# Patient Record
Sex: Female | Born: 1955 | Race: Black or African American | Hispanic: No | Smoking: Current every day smoker
Health system: Southern US, Community
[De-identification: ages and names within clinical notes are randomized; demographics above are authoritative.]

## PROBLEM LIST (undated history)

## (undated) DIAGNOSIS — I10 Essential (primary) hypertension: Secondary | ICD-10-CM

## (undated) DIAGNOSIS — M1990 Unspecified osteoarthritis, unspecified site: Secondary | ICD-10-CM

## (undated) HISTORY — PX: ABDOMINAL HYSTERECTOMY: SHX81

---

## 1999-05-21 ENCOUNTER — Encounter: Payer: Self-pay | Admitting: *Deleted

## 1999-05-21 ENCOUNTER — Emergency Department (HOSPITAL_COMMUNITY): Admission: EM | Admit: 1999-05-21 | Discharge: 1999-05-21 | Payer: Self-pay | Admitting: Emergency Medicine

## 2001-01-03 ENCOUNTER — Emergency Department (HOSPITAL_COMMUNITY): Admission: EM | Admit: 2001-01-03 | Discharge: 2001-01-03 | Payer: Self-pay | Admitting: Emergency Medicine

## 2001-01-03 ENCOUNTER — Encounter: Payer: Self-pay | Admitting: Emergency Medicine

## 2001-02-05 ENCOUNTER — Encounter: Payer: Self-pay | Admitting: Emergency Medicine

## 2001-02-05 ENCOUNTER — Emergency Department (HOSPITAL_COMMUNITY): Admission: EM | Admit: 2001-02-05 | Discharge: 2001-02-05 | Payer: Self-pay | Admitting: Emergency Medicine

## 2001-02-23 ENCOUNTER — Emergency Department (HOSPITAL_COMMUNITY): Admission: EM | Admit: 2001-02-23 | Discharge: 2001-02-23 | Payer: Self-pay | Admitting: Emergency Medicine

## 2001-02-23 ENCOUNTER — Encounter: Payer: Self-pay | Admitting: Emergency Medicine

## 2001-07-05 ENCOUNTER — Encounter: Payer: Self-pay | Admitting: Emergency Medicine

## 2001-07-05 ENCOUNTER — Emergency Department (HOSPITAL_COMMUNITY): Admission: EM | Admit: 2001-07-05 | Discharge: 2001-07-05 | Payer: Self-pay | Admitting: Emergency Medicine

## 2001-08-03 ENCOUNTER — Encounter: Payer: Self-pay | Admitting: *Deleted

## 2001-08-03 ENCOUNTER — Inpatient Hospital Stay (HOSPITAL_COMMUNITY): Admission: AD | Admit: 2001-08-03 | Discharge: 2001-08-03 | Payer: Self-pay | Admitting: Gynecology

## 2001-08-06 ENCOUNTER — Other Ambulatory Visit: Admission: RE | Admit: 2001-08-06 | Discharge: 2001-08-06 | Payer: Self-pay | Admitting: *Deleted

## 2002-04-07 ENCOUNTER — Emergency Department (HOSPITAL_COMMUNITY): Admission: EM | Admit: 2002-04-07 | Discharge: 2002-04-07 | Payer: Self-pay | Admitting: Emergency Medicine

## 2002-04-12 ENCOUNTER — Encounter: Admission: RE | Admit: 2002-04-12 | Discharge: 2002-04-12 | Payer: Self-pay | Admitting: *Deleted

## 2002-04-19 ENCOUNTER — Inpatient Hospital Stay (HOSPITAL_COMMUNITY): Admission: AD | Admit: 2002-04-19 | Discharge: 2002-04-20 | Payer: Self-pay | Admitting: *Deleted

## 2002-04-20 ENCOUNTER — Encounter (INDEPENDENT_AMBULATORY_CARE_PROVIDER_SITE_OTHER): Payer: Self-pay | Admitting: *Deleted

## 2002-04-26 ENCOUNTER — Encounter: Admission: RE | Admit: 2002-04-26 | Discharge: 2002-04-26 | Payer: Self-pay | Admitting: *Deleted

## 2002-11-17 ENCOUNTER — Encounter: Payer: Self-pay | Admitting: Family Medicine

## 2002-11-17 ENCOUNTER — Inpatient Hospital Stay (HOSPITAL_COMMUNITY): Admission: AD | Admit: 2002-11-17 | Discharge: 2002-11-19 | Payer: Self-pay | Admitting: Family Medicine

## 2002-12-12 ENCOUNTER — Inpatient Hospital Stay (HOSPITAL_COMMUNITY): Admission: AD | Admit: 2002-12-12 | Discharge: 2002-12-12 | Payer: Self-pay | Admitting: Obstetrics and Gynecology

## 2002-12-15 ENCOUNTER — Encounter: Admission: RE | Admit: 2002-12-15 | Discharge: 2002-12-15 | Payer: Self-pay | Admitting: Obstetrics and Gynecology

## 2002-12-27 ENCOUNTER — Observation Stay (HOSPITAL_COMMUNITY): Admission: AD | Admit: 2002-12-27 | Discharge: 2002-12-28 | Payer: Self-pay | Admitting: *Deleted

## 2002-12-29 ENCOUNTER — Encounter (INDEPENDENT_AMBULATORY_CARE_PROVIDER_SITE_OTHER): Payer: Self-pay | Admitting: *Deleted

## 2002-12-29 ENCOUNTER — Inpatient Hospital Stay (HOSPITAL_COMMUNITY): Admission: RE | Admit: 2002-12-29 | Discharge: 2002-12-31 | Payer: Self-pay | Admitting: Obstetrics and Gynecology

## 2003-01-10 ENCOUNTER — Encounter: Admission: RE | Admit: 2003-01-10 | Discharge: 2003-01-10 | Payer: Self-pay | Admitting: Obstetrics and Gynecology

## 2003-01-24 ENCOUNTER — Encounter: Admission: RE | Admit: 2003-01-24 | Discharge: 2003-01-24 | Payer: Self-pay | Admitting: Obstetrics and Gynecology

## 2003-02-09 ENCOUNTER — Encounter: Admission: RE | Admit: 2003-02-09 | Discharge: 2003-02-09 | Payer: Self-pay | Admitting: Family Medicine

## 2003-10-20 ENCOUNTER — Emergency Department (HOSPITAL_COMMUNITY): Admission: EM | Admit: 2003-10-20 | Discharge: 2003-10-20 | Payer: Self-pay | Admitting: Emergency Medicine

## 2005-04-04 ENCOUNTER — Emergency Department (HOSPITAL_COMMUNITY): Admission: EM | Admit: 2005-04-04 | Discharge: 2005-04-04 | Payer: Self-pay | Admitting: *Deleted

## 2006-01-06 ENCOUNTER — Emergency Department (HOSPITAL_COMMUNITY): Admission: EM | Admit: 2006-01-06 | Discharge: 2006-01-06 | Payer: Self-pay | Admitting: Emergency Medicine

## 2006-08-23 ENCOUNTER — Emergency Department (HOSPITAL_COMMUNITY): Admission: EM | Admit: 2006-08-23 | Discharge: 2006-08-23 | Payer: Self-pay | Admitting: Emergency Medicine

## 2007-01-13 ENCOUNTER — Emergency Department (HOSPITAL_COMMUNITY): Admission: EM | Admit: 2007-01-13 | Discharge: 2007-01-13 | Payer: Self-pay | Admitting: Emergency Medicine

## 2007-04-13 ENCOUNTER — Emergency Department (HOSPITAL_COMMUNITY): Admission: EM | Admit: 2007-04-13 | Discharge: 2007-04-13 | Payer: Self-pay | Admitting: Emergency Medicine

## 2008-01-12 ENCOUNTER — Emergency Department (HOSPITAL_COMMUNITY): Admission: EM | Admit: 2008-01-12 | Discharge: 2008-01-12 | Payer: Self-pay | Admitting: Emergency Medicine

## 2010-04-18 ENCOUNTER — Emergency Department (HOSPITAL_COMMUNITY)
Admission: EM | Admit: 2010-04-18 | Discharge: 2010-04-18 | Payer: Self-pay | Source: Home / Self Care | Admitting: Emergency Medicine

## 2010-11-22 NOTE — Op Note (Signed)
[name], [name]                         ACCOUNT NO.:  [id number]   MEDICAL RECORD NO.:  [id number]                   PATIENT TYPE:  INP   LOCATION:  9313                                 FACILITY:  [facility]   PHYSICIAN:  Phil D. Okey Dupre, M.D.                  DATE OF BIRTH:  09-Mar-1956   DATE OF PROCEDURE:  12/29/2002  DATE OF DISCHARGE:                                 OPERATIVE REPORT   PROCEDURE:  Total vaginal hysterectomy.   PREOPERATIVE DIAGNOSIS:  Intractable menometrorrhagia.   POSTOPERATIVE DIAGNOSIS:  Intractable menometrorrhagia.   SURGEON:  Javier Glazier. Okey Dupre, M.D.   ANESTHESIA:  General.   ESTIMATED BLOOD LOSS:  Less than 100 mL.   OPERATIVE FINDINGS:  On bimanual pelvic exam, the uterus was upper limits of  normal in size, freely movable with normal adnexa.  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   PROCEDURE IN DETAIL:  Under satisfactory general anesthesia, with the  patient in the dorsal lithotomy position, the perineum and vagina were  prepped and draped in the usual sterile manner.  A weighted speculum was  placed in the posterior fourchette of the vagina and the anterior and  posterior lips of the cervix grasped with Lahey clamps.  The entire  circumference of the cervix was injected with 1% Xylocaine with 1:100,000  epinephrine for a total of 20 mL around the entire circumference,  approximately 2 cm from the distal end of the cervix.  A circumferential  incision with a scalpel was made around the entire circumference of the  cervix 1.5 cm from the distal end and the mucosa pushed away from the distal  end of the cervix by blunt dissection.  The cul-de-sac of Riley Lam was then  entered by sharp dissection, a figure-of-eight suture placed in that area  and held long for hemostasis.  The pedicles were then clamped, divided, and  ligated with 1 #1 chromic catgut suture ligatures serially, starting with  the uterosacrals, the cardinals, the packet containing the  uterine vessels.  At this point, the peritoneal cavity was entered just below the bladder and  Heaney clamps were used to grasp the remaining portion of the posterior  pedicle and attached to the anterior visceroperitoneum on the uterus.  The  pedicle was cut away from the uterus and ligated with #1 chromic catgut  suture ligatures.  Free ties were then placed around the utero-ovarian  ligaments with #1 chromic catgut and held long and Heaney clamps were placed  medial to those ties and the tissue medial was dissected away from the  clamp, thus removing the uterus en toto and the lateral pedicle ligated with  #1 chromic catgut suture ligatures.  The utero-ovarians were tied in the  midline, these areas were observed for bleeding, both ovaries were normal  with the exception of a small functional cyst on the right ovary.  The  vaginal  cuff was closed with a continuous running locked #1 chromic catgut  which went from peritoneal into mucosa.  An Iodoform pack was  placed in the vagina as well as a Foley catheter in the bladder which  drained clear.  At the end of the procedure, total blood loss less than 100  mL.  The patient was transferred to the recovery room in satisfactory  condition.  Tape, sponge, and needle counts were reported as correct at the  end of the procedure.                                               Phil D. Okey Dupre, M.D.    PDR/MEDQ  D:  12/29/2002  T:  12/30/2002  Job:  [id number]

## 2010-11-22 NOTE — Discharge Summary (Signed)
   [name], [name]                     ACCOUNT NO.:  [id number]   MEDICAL RECORD NO.:  [id number]                   PATIENT TYPE:  INP   LOCATION:  9314                                 FACILITY:  [facility]   PHYSICIAN:  Phil D. Okey Dupre, M.D.                  DATE OF BIRTH:  08-24-1955   DATE OF ADMISSION:  04/19/2002  DATE OF DISCHARGE:                                 DISCHARGE SUMMARY   HISTORY OF PRESENT ILLNESS:  The patient is a 55 year old black female para  5-0-0-4 who over the past year had multiple emergency room visits for  abnormal heavy bleeding.  She came into the emergency room at [facility] on the day of admission bleeding moderately with a hemoglobin of  5.5, hematocrit of 17.2.  The patient was admitted and transfused with 4  units of packed cells and on the a.m. of the procedure she had a 10  hemoglobin and a 29.7 hematocrit.  The patient was taken to the operating  room on the day of discharge and a dilatation and curettage was carried out  with an attempted hysterectomy that was not successful because the cervix  had already been dilated too much for the uterus to hold any fluid for good  visualization.  The patient was bleeding only slightly at the time of  discharge and was put on Provera b.i.d. 10 mg to be followed up in the GYN  Clinic in one week.   DISCHARGE DIAGNOSES:  Dysfunctional uterine bleeding pending pathology  report.   PLAN:  Pbserve the patient.  See how well she does on the Provera.  If we  cannot control the bleeding by hormonal therapy, then we would do a vaginal  hysterectomy.   CONDITION ON DISCHARGE:  Satisfactory post transfusion and dilatation and  curettage.                                               Phil D. Okey Dupre, M.D.    PDR/MEDQ  D:  04/20/2002  T:  04/20/2002  Job:  [id number]   cc:   [facility]

## 2010-11-22 NOTE — Discharge Summary (Signed)
   NAMEBLANCH, STANG                         ACCOUNT NO.:  [id number]   MEDICAL RECORD NO.:  [id number]                   PATIENT TYPE:  INP   LOCATION:  9322                                 FACILITY:  [facility]   PHYSICIAN:  Phil D. Okey Dupre, M.D.                  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 Shawnie Pons because of dysfunctional uterine bleeding. She was  treated with IV Premarin. In treating the patient, this resulted in  admission. On the last admission, the patient had a transfusion. We were  preparing the patient with Lupron to control the size of the uterus as well  as the bleeding in order to save blood at the time of surgery. She was  stable on the day after admission. Was observed for 24 more hours with no  significant bleeding. It was decided to discharge the patient on iron, while  we watched her hemoglobin increase as the Depo-Lupron began to take effect.  The patient's admitting hemoglobin was 7.1 with hematocrit of 22.4 and at  discharge 6.7 with 21.1 hemoglobin. This was an interim admission after  previous admission for blood transfusion when the patient got down to a  hemoglobin of 4. The patient has been scheduled in six months for total  abdominal hysterectomy.                                               Phil D. Okey Dupre, M.D.    PDR/MEDQ  D:  01/15/2003  T:  01/15/2003  Job:  [id number]

## 2010-11-22 NOTE — H&P (Signed)
   [name], [name]                         ACCOUNT NO.:  [id number]   MEDICAL RECORD NO.:  [id number]                   PATIENT TYPE:   LOCATION:                                       FACILITY:   PHYSICIAN:  Phil D. Okey Dupre, M.D.                  DATE OF BIRTH:  1955-11-28   DATE OF ADMISSION:  12/29/2002  DATE OF DISCHARGE:                                HISTORY & PHYSICAL   CHIEF COMPLAINT:  Continual vaginal bleeding.   HISTORY OF PRESENT ILLNESS:  The patient is a 55 year old black female, para  5-0-0-4, who over the past year has made multiple emergency room visits for  abnormal heavy vaginal bleeding.  She tried hormones with no help and  underwent a D&C within the past two months.  She has also been transfused  because of the blood loss anemia.  She is being admitted for total vaginal  hysterectomy.   PAST MEDICAL HISTORY:  The patient is in good health with the exception of  the bleeding.  She has had no significant past medical history.   SOCIAL HISTORY:  The patient does smoke, but does not drink alcohol or take  illicit drugs.   FAMILY HISTORY:  There is a history of diabetes and high blood pressure in  the family.   REVIEW OF SYSTEMS:  Negative, except for the present illness.   PHYSICAL EXAMINATION:  VITAL SIGNS:  Blood pressure 138/73, pulse 88 per  minute, temperature 98.5 degrees, respirations 18 per minute.  WEIGHT:  155.3 pounds.  GENERAL APPEARANCE:  A well-developed, well-nourished, black female in no  acute distress.  HEENT:  Within normal limits.  LUNGS:  Clear to auscultation and percussion.  NECK:  Supple with no masses.  HEART:  No murmur.  Normal sinus rhythm.  ABDOMEN:  Soft and nontender.  No masses or organomegaly.  GENITOURINARY:  External genitalia normal.  The vagina is clean, well  rugated, and well estrogenized.  The cervix is clean.  Parous uterus about  10 weeks size of normal consistency and shape.  Normal adnexa.  RECTAL:  No  masses.  SKIN:  Normal turgor.  EXTREMITIES:  The deep tendon reflexes are within normal limits with no  edema and no varices.   IMPRESSION:  The patient is in good physical health, except has severe  recurrent anemia secondary to intractable dysfunctional uterine bleeding.   PLAN:  Vaginal hysterectomy.                                              Phil D. Okey Dupre, M.D.   PDR/MEDQ  D:  12/28/2002  T:  12/28/2002  Job:  [id number]

## 2010-11-22 NOTE — Discharge Summary (Signed)
   NAMEMINDA, FAAS                         ACCOUNT NO.:  [id number]   MEDICAL RECORD NO.:  [id number]                   PATIENT TYPE:  INP   LOCATION:  9313                                 [facility]ACILITY:  [facility]   PHYSICIAN:  Phil D. Okey Dupre, M.D.                  DATE OF BIRTH:  [birth year]   DATE OF ADMISSION:  12/29/2002  DATE OF DISCHARGE:  12/31/2002                                 DISCHARGE SUMMARY   The patient is a 54 year old black female with intractable menometorrhagia  who was admitted for total vaginal hysterectomy which was done on the day of  admission.  Her surgery went very well and the patient has had a  unremarkable postoperative course.   HOSPITAL COURSE:  The patient has been completely afebrile since the  surgery.  She started out with a hematocrit of 35.4 with a hemoglobin of  11.3.  On the day two post surgery her hematocrit was 31.8 with hemoglobin  of 10.3 and at discharge it was 32.6 with a hemoglobin at 10.4.  White count  has been normal 8.1.  Her urine was negative.   PHYSICAL EXAMINATION AT DISCHARGE:  HEENT:  Within normal limits.  NECK:  Supple with no masses.  LUNGS:  Clear to auscultation and percussion.  HEART:  No murmur.  Normal sinus rhythm.  ABDOMEN:  Soft, flat and nontender.  No masses, no organomegaly.  GENITAL:  Not done but there was minimal genital bleeding.  EXTREMITIES:  Normal with no pain.  No Homans sign.  No CVA tenderness.  SKIN:  Normal turgor.   IMPRESSION:  Satisfactory recovery postoperative after vaginal hysterectomy.   The patient has been given discharge instructions as to activity, it is best  to avoid heavy lifting and stairs for two weeks.  To return to the clinic to  see me in two weeks.  To call in if she has any heavy bleeding or high  fever.  She will be discharged on Percocet for pain and will be given iron  therapy which she is not to start for another week once her bowels become  normal.   IMPRESSION:  Post  vaginal hysterectomy, satisfactory recovery.                                                 Phil D. Okey Dupre, M.D.    PDR/MEDQ  D:  12/31/2002  T:  01/01/2003  Job:  [id number]

## 2010-11-22 NOTE — Op Note (Signed)
   [name], [name]                     ACCOUNT NO.:  [id number]   MEDICAL RECORD NO.:  [id number]                   PATIENT TYPE:  INP   LOCATION:  9314                                 [facility]ACILITY:  [facility]   PHYSICIAN:  Phil D. Okey Dupre, M.D.                  DATE OF BIRTH:  [birth year]   DATE OF PROCEDURE:  04/20/2002  DATE OF DISCHARGE:                                 OPERATIVE REPORT   PROCEDURE:  Attempted hysteroscopy, dilatation and curettage.   PREOPERATIVE DIAGNOSES:  Dysfunctional bleeding.   POSTOPERATIVE DIAGNOSES:  Pending pathology report.   PROCEDURE AS FOLLOWS:  Under satisfactory general anesthesia with the  patient in a dorsal lithotomy position, the perineum and vagina were prepped  and draped in the usual sterile manner.  Bimanual pelvic examination  revealed the uterus of normal size, shape, consistency, anterior, flexed  freely, movable, normal free adnexa.  A weighted speculum was placed in the  posterior fourchette of the vagina.  Anterior lip of the cervix was grasped  with a single tooth tenaculum and the cervix came down just inside the  introitus.  The uterine cavity was sounded to a depth of 9 cm.  The cervical  os  was already dilated up to the size of at least a 10 Hegar dilator to  attempt hysteroscopy is not successful because the patient was bleeding  moderately and any fluid put in would come out before we were able to get a  clear view of the endometrium.  Therefore, it was decided to do just a  fractional D&C.  Weighted speculum was placed in the posterior fourchette of  the vagina.  Anterior lip of the cervix grasped with a single tooth  tenaculum.  The endocervical canal was curetted with a small serrated  curette and this was sent separately for pathological diagnosis.  The  uterine cavity was then explored with the polyp forceps followed by  curettage vigorously with the moderate sized sharp curette followed by a  serrated curette and a large  amount of endometrial tissue was obtained and  sent for pathological diagnosis.  The tenaculum and speculum were then  removed from the vagina and patient was transferred to recovery room in  satisfactory condition after having tolerated the procedure well.                                               Phil D. Okey Dupre, M.D.    PDR/MEDQ  D:  04/20/2002  T:  04/20/2002  Job:  [id number]   cc:   GYN Clinic

## 2011-04-22 LAB — URINE MICROSCOPIC-ADD ON

## 2011-04-22 LAB — URINALYSIS, ROUTINE W REFLEX MICROSCOPIC
Nitrite: NEGATIVE
Protein, ur: NEGATIVE
Specific Gravity, Urine: 1.017
pH: 6

## 2011-04-22 LAB — I-STAT 8, (EC8 V) (CONVERTED LAB)
BUN: 7
Chloride: 105
Glucose, Bld: 110 — ABNORMAL HIGH
Potassium: 3.5
TCO2: 27
pCO2, Ven: 43.6 — ABNORMAL LOW

## 2011-04-22 LAB — HEPATIC FUNCTION PANEL
AST: 47 — ABNORMAL HIGH
Albumin: 3.9
Alkaline Phosphatase: 95
Bilirubin, Direct: 0.1
Indirect Bilirubin: 0.5

## 2011-04-22 LAB — CBC
Hemoglobin: 13.4
Platelets: 218
RBC: 4.19
RDW: 12.7

## 2011-04-22 LAB — DIFFERENTIAL
Basophils Absolute: 0
Basophils Relative: 1
Eosinophils Absolute: 0
Lymphocytes Relative: 38
Lymphs Abs: 1.8
Neutro Abs: 2.5

## 2011-04-22 LAB — D-DIMER, QUANTITATIVE: D-Dimer, Quant: 0.44

## 2012-01-27 ENCOUNTER — Encounter (HOSPITAL_COMMUNITY): Payer: Self-pay | Admitting: *Deleted

## 2012-01-27 ENCOUNTER — Emergency Department (HOSPITAL_COMMUNITY)
Admission: EM | Admit: 2012-01-27 | Discharge: 2012-01-27 | Disposition: A | Discharge status: Home / Self Care | Payer: Self-pay | Attending: Emergency Medicine | Admitting: Emergency Medicine

## 2012-01-27 DIAGNOSIS — I10 Essential (primary) hypertension: Secondary | ICD-10-CM | POA: Insufficient documentation

## 2012-01-27 DIAGNOSIS — L0231 Cutaneous abscess of buttock: Secondary | ICD-10-CM | POA: Insufficient documentation

## 2012-01-27 DIAGNOSIS — F17200 Nicotine dependence, unspecified, uncomplicated: Secondary | ICD-10-CM | POA: Insufficient documentation

## 2012-01-27 DIAGNOSIS — L03317 Cellulitis of buttock: Secondary | ICD-10-CM | POA: Insufficient documentation

## 2012-01-27 HISTORY — DX: Essential (primary) hypertension: I10

## 2012-01-27 MED ORDER — LISINOPRIL-HYDROCHLOROTHIAZIDE 10-12.5 MG PO TABS: 1.0000 | ORAL_TABLET | Freq: Every day | ORAL | Status: DC

## 2012-01-27 MED ORDER — HYDROCODONE-ACETAMINOPHEN 5-325 MG PO TABS: 2.0000 | ORAL_TABLET | ORAL | Status: AC | PRN

## 2012-01-27 NOTE — ED Notes (Signed)
Pt has a boil on her butt and states it is hard and large.  Abscess is to left buttock area.  Not draining

## 2012-01-27 NOTE — ED Provider Notes (Signed)
History     CSN: [id number]  Arrival date & time 01/27/12  1435   First MD Initiated Contact with Patient 01/27/12 1555      Chief Complaint  Patient presents with  . Abscess    (Consider location/radiation/quality/duration/timing/severity/associated sxs/prior treatment) HPI Complains of painful abscess on left buttock noticed one week ago, pain is worse with sitting or palpation pain nonradiating no other associated symptoms. No fever no other associated symptoms no treatment prior to coming here. Past Medical History  Diagnosis Date  . Hypertension     History reviewed. No pertinent past surgical history.  No family history on file.  History  Substance Use Topics  . Smoking status: Current Everyday Smoker  . Smokeless tobacco: Not on file  . Alcohol Use: Yes     occ    OB History    Grav Para Term Preterm Abortions TAB SAB Ect Mult Living                  Review of Systems  Constitutional: Negative.   Respiratory: Negative.   Gastrointestinal: Negative.   Skin: Positive for wound.       Abscess left buttock  All other systems reviewed and are negative.    Allergies  Review of patient's allergies indicates no known allergies.  Home Medications  No current outpatient prescriptions on file.  BP 164/99  Pulse 108  Temp 98.7 F (37.1 C) (Oral)  Resp 18  SpO2 99%  Physical Exam  Nursing note and vitals reviewed. Constitutional: She appears well-developed and well-nourished.  HENT:  Head: Normocephalic and atraumatic.  Eyes: Conjunctivae are normal. Pupils are equal, round, and reactive to light.  Neck: Neck supple. No tracheal deviation present. No thyromegaly present.  Cardiovascular: Normal rate and regular rhythm.   No murmur heard. Pulmonary/Chest: Effort normal and breath sounds normal.  Abdominal: Soft. Bowel sounds are normal. She exhibits no distension. There is no tenderness.  Musculoskeletal: Normal range of motion. She exhibits no edema  and no tenderness.  Neurological: She is alert. Coordination normal.  Skin: Skin is warm and dry. No rash noted.       Golf ball sized fluctuant abscess left buttock not involving perirectal  Psychiatric: She has a normal mood and affect.    ED Course  Procedures (including critical care time) INCISION AND DRAINAGE Performed by: Doug Sou Consent: Verbal consent obtained. Risks and benefits: risks, benefits and alternatives were discussed Type: abscess  Body area: Left butock  Anesthesia: local infiltration  Local anesthetic: lidocaine 2% without epinephrine  Anesthetic total: 5 ml  Complexity: complex Blunt dissection to break up loculations  Drainage: purulent  Drainage amount: Moderate   Packing material: 1/4 in iodoform gauze  Patient tolerance: Patient tolerated the procedure well with no immediate complications.   Labs Reviewed - No data to display No results found.   No diagnosis found.    MDM  Plan prescription Vicodin Followup [facility] 2 days for recheck and to have packing pulled Diagnosis abscess left buttock #2 hypertension Plan prescription lisinopril HCTZ Vicodin Recheck of wound 2 days [facility] blood pressure recheck within 2 weeks        Doug Sou, MD 01/27/12 1700

## 2012-01-29 ENCOUNTER — Encounter (HOSPITAL_COMMUNITY): Payer: Self-pay | Admitting: Emergency Medicine

## 2012-01-29 ENCOUNTER — Emergency Department (HOSPITAL_COMMUNITY)
Admission: EM | Admit: 2012-01-29 | Discharge: 2012-01-29 | Disposition: A | Discharge status: Home / Self Care | Payer: Self-pay | Attending: Emergency Medicine | Admitting: Emergency Medicine

## 2012-01-29 DIAGNOSIS — F17200 Nicotine dependence, unspecified, uncomplicated: Secondary | ICD-10-CM | POA: Insufficient documentation

## 2012-01-29 DIAGNOSIS — L0231 Cutaneous abscess of buttock: Secondary | ICD-10-CM | POA: Insufficient documentation

## 2012-01-29 DIAGNOSIS — I10 Essential (primary) hypertension: Secondary | ICD-10-CM | POA: Insufficient documentation

## 2012-01-29 DIAGNOSIS — L0291 Cutaneous abscess, unspecified: Secondary | ICD-10-CM

## 2012-01-29 MED ORDER — BACITRACIN 500 UNIT/GM EX OINT
1.0000 "application " | TOPICAL_OINTMENT | Freq: Two times a day (BID) | CUTANEOUS | Status: DC
  Administered 2012-01-29: 1 via TOPICAL
  Filled 2012-01-29: qty 0.9

## 2012-01-29 NOTE — ED Provider Notes (Signed)
History  This chart was scribed for Doug Sou, MD by Ladona Ridgel Day. This patient was seen in room TR02C/TR02C and the patient's care was started at 0932.   CSN: [id number]  Arrival date & time 01/29/12  1191   First MD Initiated Contact with Patient 01/29/12 1107      Chief Complaint  Patient presents with  . Wound Check    The history is provided by the patient. No language interpreter was used.   [name] is a 56 y.o. female who presents to the Emergency Department complaining of needing wound recheck and is feeling  better. She was seen here two days ago for abscess on her buttocks. Had abscess I and d'd She states she had a subjectuive fever yesterday and her pain medicine has somewhat helped her pain. She denies any other injuries or illnesses at this time.   Past Medical History  Diagnosis Date  . Hypertension     History reviewed. No pertinent past surgical history.  History reviewed. No pertinent family history.  History  Substance Use Topics  . Smoking status: Current Everyday Smoker  . Smokeless tobacco: Not on file  . Alcohol Use: Yes     occ    OB History    Grav Para Term Preterm Abortions TAB SAB Ect Mult Living                  Review of Systems  Constitutional: Positive for fever.  HENT: Negative.   Respiratory: Negative.   Cardiovascular: Negative.   Gastrointestinal: Negative.   Musculoskeletal: Negative.   Skin: Negative.        Her healing wound is itchy.   Neurological: Negative.   Hematological: Negative.   Psychiatric/Behavioral: Negative.     Allergies  Review of patient's allergies indicates no known allergies.  Home Medications   Current Outpatient Rx  Name Route Sig Dispense Refill  . HYDROCODONE-ACETAMINOPHEN 5-325 MG PO TABS Oral Take 2 tablets by mouth every 4 (four) hours as needed for pain. 16 tablet 0  . LISINOPRIL-HYDROCHLOROTHIAZIDE 10-12.5 MG PO TABS Oral Take 1 tablet by mouth daily. 30 tablet 0    BP  128/97  Pulse 104  Temp 99.2 F (37.3 C) (Oral)  Resp 18  SpO2 96%  Physical Exam  Nursing note and vitals reviewed. Constitutional: She appears well-developed and well-nourished.  HENT:  Head: Normocephalic and atraumatic.  Eyes: EOM are normal.  Neck: Neck supple. No tracheal deviation present.  Cardiovascular:  Murmur heard. Pulmonary/Chest: Effort normal.  Abdominal: She exhibits no distension.  Musculoskeletal: Normal range of motion. She exhibits no edema and no tenderness.       Left buttock with clean appearing open wound. No surrounding redness no purulent discharge  Neurological: She is alert. Coordination normal.  Skin: Skin is warm and dry. No rash noted.  Psychiatric: She has a normal mood and affect.    ED Course  Procedures (including critical care time) DIAGNOSTIC STUDIES: Oxygen Saturation is 96% on room air, adequate by my interpretation.    COORDINATION OF CARE: At 1131 AM Discussed treatment plan with patient which includes antibiotic ointment applied to her wound and bandage afterwards. Patient agrees.   Labs Reviewed - No data to display No results found.   No diagnosis found.    MDM  Drain abscess is well-healing Plan topical antibiotics local wound care return as needed Diagnosis healing abscess to left buttock  I personally performed the services described in this documentation, which was  scribed in my presence. The recorded information has been reviewed and considered.        Doug Sou, MD 01/29/12 1137

## 2012-01-29 NOTE — ED Notes (Signed)
Pt here for recheck of abscess to buttocks that had I/D 2 days ago; pt sts taking meds and improving

## 2012-01-29 NOTE — ED Notes (Signed)
Wound covered with bacitracin and gauze.

## 2012-01-29 NOTE — ED Notes (Signed)
Pt has not filled Rx given to her on d/c Tuesday when she was seen for I&D.

## 2012-02-18 ENCOUNTER — Emergency Department (HOSPITAL_COMMUNITY)
Admission: EM | Admit: 2012-02-18 | Discharge: 2012-02-18 | Disposition: A | Discharge status: Home / Self Care | Payer: Self-pay | Attending: Emergency Medicine | Admitting: Emergency Medicine

## 2012-02-18 ENCOUNTER — Emergency Department (HOSPITAL_COMMUNITY): Payer: Self-pay

## 2012-02-18 ENCOUNTER — Encounter (HOSPITAL_COMMUNITY): Payer: Self-pay | Admitting: *Deleted

## 2012-02-18 DIAGNOSIS — N390 Urinary tract infection, site not specified: Secondary | ICD-10-CM | POA: Insufficient documentation

## 2012-02-18 DIAGNOSIS — M545 Low back pain, unspecified: Secondary | ICD-10-CM | POA: Insufficient documentation

## 2012-02-18 DIAGNOSIS — M549 Dorsalgia, unspecified: Secondary | ICD-10-CM

## 2012-02-18 DIAGNOSIS — F17200 Nicotine dependence, unspecified, uncomplicated: Secondary | ICD-10-CM | POA: Insufficient documentation

## 2012-02-18 DIAGNOSIS — I10 Essential (primary) hypertension: Secondary | ICD-10-CM | POA: Insufficient documentation

## 2012-02-18 LAB — URINALYSIS, ROUTINE W REFLEX MICROSCOPIC
Hgb urine dipstick: NEGATIVE
Specific Gravity, Urine: 1.017 (ref 1.005–1.030)
pH: 6 (ref 5.0–8.0)

## 2012-02-18 MED ORDER — NAPROXEN 375 MG PO TABS: 375.0000 mg | ORAL_TABLET | Freq: Two times a day (BID) | ORAL | Status: AC

## 2012-02-18 MED ORDER — KETOROLAC TROMETHAMINE 15 MG/ML IJ SOLN
15.0000 mg | Freq: Once | INTRAMUSCULAR | Status: AC
  Administered 2012-02-18: 15 mg via INTRAMUSCULAR

## 2012-02-18 MED ORDER — ONDANSETRON 4 MG PO TBDP
4.0000 mg | ORAL_TABLET | Freq: Once | ORAL | Status: AC
  Administered 2012-02-18: 4 mg via ORAL
  Filled 2012-02-18: qty 1

## 2012-02-18 MED ORDER — KETOROLAC TROMETHAMINE 30 MG/ML IJ SOLN
INTRAMUSCULAR | Status: AC
  Filled 2012-02-18: qty 1

## 2012-02-18 MED ORDER — MORPHINE SULFATE 4 MG/ML IJ SOLN
4.0000 mg | Freq: Once | INTRAMUSCULAR | Status: AC
  Administered 2012-02-18: 4 mg via INTRAMUSCULAR
  Filled 2012-02-18: qty 1

## 2012-02-18 MED ORDER — CIPROFLOXACIN HCL 500 MG PO TABS: 500.0000 mg | ORAL_TABLET | Freq: Two times a day (BID) | ORAL | Status: DC

## 2012-02-18 NOTE — ED Notes (Signed)
RN came and got pt from waiting room--moved pt in computer

## 2012-02-18 NOTE — ED Notes (Signed)
Patient transported to CT 

## 2012-02-18 NOTE — ED Provider Notes (Signed)
History     CSN: [id number]  Arrival date & time 02/18/12  0038   First MD Initiated Contact with Patient 02/18/12 0216      Chief Complaint  Patient presents with  . Back Pain    (Consider location/radiation/quality/duration/timing/severity/associated sxs/prior treatment) Patient is a 56 y.o. female presenting with back pain. The history is provided by the patient.  Back Pain  This is a new problem. The current episode started 3 to 5 hours ago. The problem occurs constantly. The problem has not changed since onset.The pain is associated with no known injury. The pain is present in the lumbar spine. The quality of the pain is described as stabbing and aching. The pain does not radiate. The pain is at a severity of 5/10. The pain is moderate. The symptoms are aggravated by bending, twisting and certain positions. Pertinent negatives include no numbness.    Past Medical History  Diagnosis Date  . Hypertension     Past Surgical History  Procedure Date  . Abdominal hysterectomy     No family history on file.  History  Substance Use Topics  . Smoking status: Current Everyday Smoker  . Smokeless tobacco: Not on file  . Alcohol Use: Yes     occ    OB History    Grav Para Term Preterm Abortions TAB SAB Ect Mult Living                  Review of Systems  Musculoskeletal: Positive for back pain.  Neurological: Negative for numbness.  All other systems reviewed and are negative.    Allergies  Review of patient's allergies indicates no known allergies.  Home Medications   Current Outpatient Rx  Name Route Sig Dispense Refill  . LISINOPRIL-HYDROCHLOROTHIAZIDE 10-12.5 MG PO TABS Oral Take 1 tablet by mouth daily as needed.      BP 153/93  Pulse 90  Temp 97.7 F (36.5 C) (Oral)  Resp 20  SpO2 99%  Physical Exam  Constitutional: She is oriented to person, place, and time. She appears well-developed and well-nourished.  HENT:  Head: Normocephalic and  atraumatic.  Eyes: Conjunctivae and EOM are normal. Pupils are equal, round, and reactive to light.  Neck: Normal range of motion.  Cardiovascular: Normal rate, regular rhythm and normal heart sounds.   Pulmonary/Chest: Effort normal and breath sounds normal.  Abdominal: Soft. Bowel sounds are normal.  Musculoskeletal: Normal range of motion.  Neurological: She is alert and oriented to person, place, and time.  Skin: Skin is warm and dry.  Psychiatric: She has a normal mood and affect. Her behavior is normal.    ED Course  Procedures (including critical care time)  Labs Reviewed  URINALYSIS, ROUTINE W REFLEX MICROSCOPIC - Abnormal; Notable for the following:    APPearance CLOUDY (*)     Nitrite POSITIVE (*)     Leukocytes, UA TRACE (*)     All other components within normal limits  URINE MICROSCOPIC-ADD ON - Abnormal; Notable for the following:    Squamous Epithelial / LPF FEW (*)     Bacteria, UA MANY (*)     All other components within normal limits   Ct Abdomen Pelvis Wo Contrast  02/18/2012  *RADIOLOGY REPORT*  Clinical Data: Low back pain.  Pain with movement.  Positional back pain.  CT ABDOMEN AND PELVIS WITHOUT CONTRAST  Technique:  Multidetector CT imaging of the abdomen and pelvis was performed following the standard protocol without intravenous contrast.  Comparison: None.  Findings: There are no aggressive osseous lesions.  Grade 1 anterolisthesis of L4 on L5 is present with degenerative disc disease and severe facet arthrosis.  Moderate central stenosis present at L4-L5.  The lung bases show 4 mm pulmonary nodules in the lower lobes bilaterally (image 1).  Unenhanced CT was performed per clinician order.  Lack of IV contrast limits sensitivity and specificity, especially for evaluation of abdominal/pelvic solid viscera.  The liver appears within normal limits.  Gallbladder normal.  No calcified gallstones.  Pancreas appears normal.  Spleen normal.  Adrenal glands normal.  No  renal calculi.  Both ureters are within normal limits.  Urinary bladder normal.  Physiologic free fluid in the anatomic pelvis.  Normal appendix.  Small bowel grossly within normal limits.  Colonic diverticulosis without diverticulitis. Mild iliac artery atherosclerosis. Hysterectomy.  Subcutaneous injections site in the left gluteal subcutaneous fat.  IMPRESSION: 1.  No acute intra-abdominal abnormality. 2.  L4-L5 grade 1 anterolisthesis with moderate central stenosis. Anterolisthesis appears degenerative associated with facet arthrosis. 3.  Hysterectomy. 4.  4 mm bilateral lower lobe pulmonary nodules. If the patient is at high risk for bronchogenic carcinoma, follow-up chest CT at 1 year is recommended.  If the patient is at low risk, no follow-up is needed.  This recommendation follows the consensus statement: Guidelines for Management of Small Pulmonary Nodules Detected on CT Scans:  A Statement from the Fleischner Society as published in Radiology 2005; 237:395-400.  Original Report Authenticated By: Andreas Newport, M.D.     1. UTI (lower urinary tract infection)   2. Back pain       MDM  No signs of epidural compression or vascular insufficiency.  Improved.  + uti will treat.  + djd.  Advised pt to fu with pmd and ret new or worsening sxs        Traquan Duarte Lytle Michaels, MD 02/18/12 [phone]

## 2012-02-18 NOTE — ED Notes (Addendum)
C/o R low back pain, onset 2230, pain worse with movement & positional, (denies: abd pain, radiation, fever, nvd, urinary or vaginal sx or bleeding). "has been doing laundry all day (bending/stooping/turning/twisting/lifting)".

## 2012-02-18 NOTE — ED Notes (Signed)
Pt ambulated with a steady gait; VSS; A&Ox3; no signs of distress; respirations even and unlabored; skin warm and dry; no questions.  

## 2012-02-23 ENCOUNTER — Encounter (HOSPITAL_COMMUNITY): Payer: Self-pay | Admitting: *Deleted

## 2012-02-23 DIAGNOSIS — M549 Dorsalgia, unspecified: Secondary | ICD-10-CM | POA: Insufficient documentation

## 2012-02-23 DIAGNOSIS — R10819 Abdominal tenderness, unspecified site: Secondary | ICD-10-CM | POA: Insufficient documentation

## 2012-02-23 DIAGNOSIS — R109 Unspecified abdominal pain: Secondary | ICD-10-CM | POA: Insufficient documentation

## 2012-02-23 LAB — CBC WITH DIFFERENTIAL/PLATELET
Basophils Absolute: 0 10*3/uL (ref 0.0–0.1)
HCT: 35.7 % — ABNORMAL LOW (ref 36.0–46.0)
Hemoglobin: 12.8 g/dL (ref 12.0–15.0)
Lymphocytes Relative: 17 % (ref 12–46)
Lymphs Abs: 1.6 10*3/uL (ref 0.7–4.0)
Monocytes Absolute: 0.8 10*3/uL (ref 0.1–1.0)
Monocytes Relative: 9 % (ref 3–12)
Neutro Abs: 6.8 10*3/uL (ref 1.7–7.7)
RBC: 3.99 MIL/uL (ref 3.87–5.11)
RDW: 12.9 % (ref 11.5–15.5)
WBC: 9.4 10*3/uL (ref 4.0–10.5)

## 2012-02-23 LAB — URINALYSIS, ROUTINE W REFLEX MICROSCOPIC
Glucose, UA: NEGATIVE mg/dL
Leukocytes, UA: NEGATIVE
Specific Gravity, Urine: 1.023 (ref 1.005–1.030)
pH: 5 (ref 5.0–8.0)

## 2012-02-23 LAB — URINE MICROSCOPIC-ADD ON

## 2012-02-23 LAB — POCT I-STAT, CHEM 8
BUN: 13 mg/dL (ref 6–23)
Calcium, Ion: 1.17 mmol/L (ref 1.12–1.23)
Chloride: 97 mEq/L (ref 96–112)

## 2012-02-23 NOTE — ED Notes (Signed)
Pt seen here last and was dx with kidney infection and was given script for antibiotic and she has been on it since Wednesday.  Pt finished the naproxen and cipro this am.  Pt states symptoms of lower back pain back and now vomiting.

## 2012-02-24 ENCOUNTER — Emergency Department (HOSPITAL_COMMUNITY)
Admission: EM | Admit: 2012-02-24 | Discharge: 2012-02-24 | Disposition: A | Discharge status: Home / Self Care | Payer: Self-pay | Attending: Emergency Medicine | Admitting: Emergency Medicine

## 2012-02-24 ENCOUNTER — Emergency Department (HOSPITAL_COMMUNITY): Payer: Self-pay

## 2012-02-24 DIAGNOSIS — R109 Unspecified abdominal pain: Secondary | ICD-10-CM

## 2012-02-24 LAB — HEPATIC FUNCTION PANEL
AST: 29 U/L (ref 0–37)
Bilirubin, Direct: <0.1 mg/dL (ref 0.0–0.3)

## 2012-02-24 MED ORDER — IBUPROFEN 600 MG PO TABS: 600.0000 mg | ORAL_TABLET | Freq: Four times a day (QID) | ORAL | Status: AC | PRN

## 2012-02-24 MED ORDER — ONDANSETRON HCL 4 MG/2ML IJ SOLN
4.0000 mg | Freq: Once | INTRAMUSCULAR | Status: AC
  Administered 2012-02-24: 4 mg via INTRAVENOUS
  Filled 2012-02-24: qty 2

## 2012-02-24 MED ORDER — HYDROCODONE-ACETAMINOPHEN 5-500 MG PO TABS: 2.0000 | ORAL_TABLET | Freq: Four times a day (QID) | ORAL | Status: AC | PRN

## 2012-02-24 MED ORDER — ONDANSETRON HCL 4 MG PO TABS: 4.0000 mg | ORAL_TABLET | Freq: Four times a day (QID) | ORAL | Status: AC

## 2012-02-24 MED ORDER — HYDROMORPHONE HCL PF 1 MG/ML IJ SOLN
1.0000 mg | Freq: Once | INTRAMUSCULAR | Status: AC
  Administered 2012-02-24: 1 mg via INTRAVENOUS
  Filled 2012-02-24: qty 1

## 2012-02-24 MED ORDER — FENTANYL CITRATE 0.05 MG/ML IJ SOLN
25.0000 ug | INTRAMUSCULAR | Status: DC | PRN
  Administered 2012-02-24 (×2): 25 ug via INTRAVENOUS
  Filled 2012-02-24 (×2): qty 2

## 2012-02-24 MED ORDER — SODIUM CHLORIDE 0.9 % IV BOLUS (SEPSIS)
1000.0000 mL | Freq: Once | INTRAVENOUS | Status: AC
  Administered 2012-02-24: 1000 mL via INTRAVENOUS

## 2012-02-24 NOTE — ED Provider Notes (Signed)
History     CSN: [id number]  Arrival date & time 02/23/12  1842   First MD Initiated Contact with Patient 02/24/12 0024      Chief Complaint  Patient presents with  . Emesis  . Back Pain    (Consider location/radiation/quality/duration/timing/severity/associated sxs/prior treatment) HPI Hx per PT. Having return of back pain and lower ABD pain R sided same as her pain that she was evaluated for here 4 days ago, worse with movement. Pain is worse in the back. 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 Was also treated for UTI. She denies any dysuria, urgency or frequency. Some dark urine. No hematuria. No F/C. Tonight N/V x 1. Sharp in quality, mod in severity.  Past Medical History  Diagnosis Date  . Hypertension     Past Surgical History  Procedure Date  . Abdominal hysterectomy     No family history on file.  History  Substance Use Topics  . Smoking status: Current Everyday Smoker  . Smokeless tobacco: Not on file  . Alcohol Use: Yes     occ    OB History    Grav Para Term Preterm Abortions TAB SAB Ect Mult Living                  Review of Systems  Constitutional: Negative for fever and chills.  HENT: Negative for neck pain and neck stiffness.   Eyes: Negative for pain.  Respiratory: Negative for shortness of breath.   Cardiovascular: Negative for chest pain.  Gastrointestinal: Positive for abdominal pain.  Genitourinary: Negative for dysuria.  Musculoskeletal: Positive for back pain.  Skin: Negative for rash.  Neurological: Negative for headaches.  All other systems reviewed and are negative.    Allergies  Review of patient's allergies indicates no known allergies.  Home Medications   Current Outpatient Rx  Name Route Sig Dispense Refill  . NAPROXEN 375 MG PO TABS Oral Take 1 tablet (375 mg total) by mouth 2 (two) times daily. 20 tablet 0    BP 136/81  Pulse 95  Temp 98.2  F (36.8 C)  Resp 18  SpO2 98%  Physical Exam  Constitutional: She is oriented to person, place, and time. She appears well-developed and well-nourished.  HENT:  Head: Normocephalic and atraumatic.  Eyes: Conjunctivae and EOM are normal. Pupils are equal, round, and reactive to light.  Neck: Trachea normal. Neck supple. No thyromegaly present.  Cardiovascular: Normal rate, regular rhythm, S1 normal, S2 normal and normal pulses.     No systolic murmur is present   No diastolic murmur is present  Pulses:      Radial pulses are 2+ on the right side, and 2+ on the left side.  Pulmonary/Chest: Effort normal and breath sounds normal. She has no wheezes. She has no rhonchi. She has no rales. She exhibits no tenderness.  Abdominal: Soft. Normal appearance and bowel sounds are normal. There is no rebound, no guarding, no CVA tenderness and negative Murphy's sign.       Tender RUQ and somewhat RLQ  Musculoskeletal:       Tender R paralumbar, no midline deformity. No LE deficits. BLE:s Calves nontender, no cords or erythema, negative Homans sign  Neurological: She is alert and oriented to person, place, and time. She has normal strength. No cranial nerve deficit or sensory deficit. GCS eye subscore is 4. GCS verbal subscore is 5. GCS  motor subscore is 6.  Skin: Skin is warm and dry. No rash noted. She is not diaphoretic.  Psychiatric: Her speech is normal.       Cooperative and appropriate    ED Course  Procedures (including critical care time)   Results for orders placed during the hospital encounter of 02/24/12  URINALYSIS, ROUTINE W REFLEX MICROSCOPIC      Component Value Range   Color, Urine YELLOW  YELLOW   APPearance CLEAR  CLEAR   Specific Gravity, Urine 1.023  1.005 - 1.030   pH 5.0  5.0 - 8.0   Glucose, UA NEGATIVE  NEGATIVE mg/dL   Hgb urine dipstick NEGATIVE  NEGATIVE   Bilirubin Urine SMALL (*) NEGATIVE   Ketones, ur 15 (*) NEGATIVE mg/dL   Protein, ur 409 (*) NEGATIVE  mg/dL   Urobilinogen, UA 1.0  0.0 - 1.0 mg/dL   Nitrite NEGATIVE  NEGATIVE   Leukocytes, UA NEGATIVE  NEGATIVE  CBC WITH DIFFERENTIAL      Component Value Range   WBC 9.4  4.0 - 10.5 K/uL   RBC 3.99  3.87 - 5.11 MIL/uL   Hemoglobin 12.8  12.0 - 15.0 g/dL   HCT 81.1 (*) 91.4 - 78.2 %   MCV 89.5  78.0 - 100.0 fL   MCH 32.1  26.0 - 34.0 pg   MCHC 35.9  30.0 - 36.0 g/dL   RDW 95.6  21.3 - 08.6 %   Platelets 247  150 - 400 K/uL   Neutrophils Relative 73  43 - 77 %   Neutro Abs 6.8  1.7 - 7.7 K/uL   Lymphocytes Relative 17  12 - 46 %   Lymphs Abs 1.6  0.7 - 4.0 K/uL   Monocytes Relative 9  3 - 12 %   Monocytes Absolute 0.8  0.1 - 1.0 K/uL   Eosinophils Relative 1  0 - 5 %   Eosinophils Absolute 0.1  0.0 - 0.7 K/uL   Basophils Relative 0  0 - 1 %   Basophils Absolute 0.0  0.0 - 0.1 K/uL  POCT I-STAT, CHEM 8      Component Value Range   Sodium 134 (*) 135 - 145 mEq/L   Potassium 3.5  3.5 - 5.1 mEq/L   Chloride 97  96 - 112 mEq/L   BUN 13  6 - 23 mg/dL   Creatinine, Ser 5.78  0.50 - 1.10 mg/dL   Glucose, Bld 98  70 - 99 mg/dL   Calcium, Ion 4.69  6.29 - 1.23 mmol/L   TCO2 26  0 - 100 mmol/L   Hemoglobin 13.9  12.0 - 15.0 g/dL   HCT 52.8  41.3 - 24.4 %  URINE MICROSCOPIC-ADD ON      Component Value Range   Squamous Epithelial / LPF FEW (*) RARE   WBC, UA 3-6  <3 WBC/hpf   Bacteria, UA MANY (*) RARE   Casts GRANULAR CAST (*) NEGATIVE   Urine-Other MUCOUS PRESENT    HEPATIC FUNCTION PANEL      Component Value Range   Total Protein 7.7  6.0 - 8.3 g/dL   Albumin 3.1 (*) 3.5 - 5.2 g/dL   AST 29  0 - 37 U/L   ALT 20  0 - 35 U/L   Alkaline Phosphatase 100  39 - 117 U/L   Total Bilirubin 0.3  0.3 - 1.2 mg/dL   Bilirubin, Direct <0.1  0.0 - 0.3 mg/dL   Indirect Bilirubin NOT CALCULATED  0.3 -  0.9 mg/dL  LIPASE, BLOOD      Component Value Range   Lipase 31  11 - 59 U/L   Ct Abdomen Pelvis Wo Contrast  02/18/2012  *RADIOLOGY REPORT*  Clinical Data: Low back pain.  Pain with  movement.  Positional back pain.  CT ABDOMEN AND PELVIS WITHOUT CONTRAST  Technique:  Multidetector CT imaging of the abdomen and pelvis was performed following the standard protocol without intravenous contrast.  Comparison: None.  Findings: There are no aggressive osseous lesions.  Grade 1 anterolisthesis of L4 on L5 is present with degenerative disc disease and severe facet arthrosis.  Moderate central stenosis present at L4-L5.  The lung bases show 4 mm pulmonary nodules in the lower lobes bilaterally (image 1).  Unenhanced CT was performed per clinician order.  Lack of IV contrast limits sensitivity and specificity, especially for evaluation of abdominal/pelvic solid viscera.  The liver appears within normal limits.  Gallbladder normal.  No calcified gallstones.  Pancreas appears normal.  Spleen normal.  Adrenal glands normal.  No renal calculi.  Both ureters are within normal limits.  Urinary bladder normal.  Physiologic free fluid in the anatomic pelvis.  Normal appendix.  Small bowel grossly within normal limits.  Colonic diverticulosis without diverticulitis. Mild iliac artery atherosclerosis. Hysterectomy.  Subcutaneous injections site in the left gluteal subcutaneous fat.  IMPRESSION: 1.  No acute intra-abdominal abnormality. 2.  L4-L5 grade 1 anterolisthesis with moderate central stenosis. Anterolisthesis appears degenerative associated with facet arthrosis. 3.  Hysterectomy. 4.  4 mm bilateral lower lobe pulmonary nodules. If the patient is at high risk for bronchogenic carcinoma, follow-up chest CT at 1 year is recommended.  If the patient is at low risk, no follow-up is needed.  This recommendation follows the consensus statement: Guidelines for Management of Small Pulmonary Nodules Detected on CT Scans:  A Statement from the Fleischner Society as published in Radiology 2005; 237:395-400.  Original Report Authenticated By: Andreas Newport, M.D.   US Abdomen Complete  02/24/2012  *RADIOLOGY REPORT*   Clinical Data:  Right upper quadrant pain and back pain for 1 week.  COMPLETE ABDOMINAL ULTRASOUND  Comparison:  CT abdomen and pelvis 02/18/2012  Findings:  Gallbladder:  No gallstones, gallbladder wall thickening, or pericholecystic fluid.  Common bile duct:  Normal caliber with measured diameter of 6.9 mm.  Liver:  No focal lesion identified.  Within normal limits in parenchymal echogenicity.  IVC:  Appears normal.  Pancreas:  Portions of the pancreas obscured by overlying bowel gas.  Visualized portions appear unremarkable.  Spleen:  Spleen length measures 8.7 cm.  Normal parenchymal echotexture.  Right Kidney:  Right kidney measures 11.6 cm length.  No hydronephrosis.  Left Kidney:  Left kidney measures 11.5 cm length.  No hydronephrosis.  Abdominal aorta:  No aneurysm identified.  IMPRESSION: Negative abdominal ultrasound.   Original Report Authenticated By: Marlon Pel, M.D.    IVFs  IV fentanyl  IV dilaudid  IV zofran  6:16 AM recheck feeling much better, given normal [place] and CT scan with pain worse with movement, possible MSK etiology. Plan close follow up.   Plan Rx pain medications and follow up PCP. Reliable historian agrees to all d/c and f/u instructions.  MDM   Nursing notes and VS reviewed. IV narcotics improved symptoms. Imaging and labs and UA as above. Old records reviewed CT as above no acute findings.          Sunnie Nielsen, MD 02/24/12 [phone]

## 2012-02-24 NOTE — ED Notes (Signed)
Patient transported to Ultrasound 

## 2012-03-19 ENCOUNTER — Emergency Department (HOSPITAL_COMMUNITY)
Admission: EM | Admit: 2012-03-19 | Discharge: 2012-03-19 | Disposition: A | Discharge status: Home / Self Care | Payer: Self-pay | Attending: Emergency Medicine | Admitting: Emergency Medicine

## 2012-03-19 DIAGNOSIS — N12 Tubulo-interstitial nephritis, not specified as acute or chronic: Secondary | ICD-10-CM | POA: Insufficient documentation

## 2012-03-19 DIAGNOSIS — F17200 Nicotine dependence, unspecified, uncomplicated: Secondary | ICD-10-CM | POA: Insufficient documentation

## 2012-03-19 DIAGNOSIS — I10 Essential (primary) hypertension: Secondary | ICD-10-CM | POA: Insufficient documentation

## 2012-03-19 LAB — URINALYSIS, ROUTINE W REFLEX MICROSCOPIC
Glucose, UA: NEGATIVE mg/dL
Nitrite: POSITIVE — AB
Protein, ur: 100 mg/dL — AB
Urobilinogen, UA: 1 mg/dL (ref 0.0–1.0)

## 2012-03-19 LAB — URINE MICROSCOPIC-ADD ON

## 2012-03-19 MED ORDER — SULFAMETHOXAZOLE-TMP DS 800-160 MG PO TABS
1.0000 | ORAL_TABLET | Freq: Once | ORAL | Status: AC
  Administered 2012-03-19: 1 via ORAL
  Filled 2012-03-19: qty 1

## 2012-03-19 MED ORDER — SULFAMETHOXAZOLE-TRIMETHOPRIM 800-160 MG PO TABS: 1.0000 | ORAL_TABLET | Freq: Two times a day (BID) | ORAL | Status: AC

## 2012-03-19 MED ORDER — TRAMADOL HCL 50 MG PO TABS: 50.0000 mg | ORAL_TABLET | Freq: Four times a day (QID) | ORAL | Status: AC | PRN

## 2012-03-19 MED ORDER — OXYCODONE-ACETAMINOPHEN 5-325 MG PO TABS
1.0000 | ORAL_TABLET | Freq: Once | ORAL | Status: AC
  Administered 2012-03-19: 1 via ORAL
  Filled 2012-03-19: qty 1

## 2012-03-19 NOTE — ED Provider Notes (Signed)
Medical screening examination/treatment/procedure(s) were performed by non-physician practitioner and as supervising physician I was immediately available for consultation/collaboration.   Ward Givens, MD 03/19/12 2042

## 2012-03-19 NOTE — ED Notes (Signed)
Patient c/o right flank pain- patient states she had a UTI a month ago, but the pain is gradually worsening and she doesn't feel like her medicine worked.  Patient denies fevers or any difficulty urinating.

## 2012-03-19 NOTE — ED Provider Notes (Signed)
History     CSN: [id number]  Arrival date & time 03/19/12  1554   First MD Initiated Contact with Patient 03/19/12 1635      Chief Complaint  Patient presents with  . Flank Pain    right    (Consider location/radiation/quality/duration/timing/severity/associated sxs/prior treatment) HPI  56 year old female presents complaining of right flank pain. Patient reports he was diagnosed with a urinary tract infection about a month ago. She has been taking antibiotic however she does not think her symptoms improved. C/o sharp, achy pain to her R lower back radiates to her lower abdomen and up her R flank.  Pain persistent, sometimes worsen with movement.  No fever, n/v/d, cp, sob or rash.  Has tried drinking water to help with sxs without improvement.  Sts pain has been getting progressively worse and decided to come to ER today for evaluation.  Denies trauma.  Has had multiple imagings including abd/pelvic CT w/ contrast, pelvic US, abd/pelvic CT w/out contrast in the past month without obvious acute findings.    Past Medical History  Diagnosis Date  . Hypertension     Past Surgical History  Procedure Date  . Abdominal hysterectomy     No family history on file.  History  Substance Use Topics  . Smoking status: Current Every Day Smoker  . Smokeless tobacco: Not on file  . Alcohol Use: Yes     occ    OB History    Grav Para Term Preterm Abortions TAB SAB Ect Mult Living                  Review of Systems  All other systems reviewed and are negative.    Allergies  Review of patient's allergies indicates no known allergies.  Home Medications   Current Outpatient Rx  Name Route Sig Dispense Refill  . NAPROXEN 375 MG PO TABS Oral Take 1 tablet (375 mg total) by mouth 2 (two) times daily. 20 tablet 0    BP 131/86  Pulse 88  Temp 98.3 F (36.8 C) (Oral)  Resp 16  SpO2 99%  Physical Exam  Nursing note and vitals reviewed. Constitutional: She appears  well-developed and well-nourished. No distress.       Awake, alert, nontoxic appearance  HENT:  Head: Atraumatic.  Eyes: Conjunctivae normal are normal. Right eye exhibits no discharge. Left eye exhibits no discharge.  Neck: Neck supple.  Cardiovascular: Normal rate and regular rhythm.   Pulmonary/Chest: Effort normal. No respiratory distress. She exhibits no tenderness.  Abdominal: Soft. There is no tenderness. There is no rebound.    Musculoskeletal: She exhibits no tenderness.       Arms:      ROM appears intact, no obvious focal weakness  Neurological:       Mental status and motor strength appears intact  Skin: No rash noted.  Psychiatric: She has a normal mood and affect.    ED Course  Procedures (including critical care time)  Labs Reviewed  URINALYSIS, ROUTINE W REFLEX MICROSCOPIC - Abnormal; Notable for the following:    Color, Urine AMBER (*)  BIOCHEMICALS MAY BE AFFECTED BY COLOR   APPearance CLOUDY (*)     Bilirubin Urine SMALL (*)     Ketones, ur TRACE (*)     Protein, ur 100 (*)     Nitrite POSITIVE (*)     Leukocytes, UA SMALL (*)     All other components within normal limits  URINE MICROSCOPIC-ADD ON - Abnormal; Notable  for the following:    Squamous Epithelial / LPF FEW (*)     Bacteria, UA MANY (*)     All other components within normal limits   Results for orders placed during the hospital encounter of 03/19/12  URINALYSIS, ROUTINE W REFLEX MICROSCOPIC      Component Value Range   Color, Urine AMBER (*) YELLOW   APPearance CLOUDY (*) CLEAR   Specific Gravity, Urine 1.030  1.005 - 1.030   pH 6.0  5.0 - 8.0   Glucose, UA NEGATIVE  NEGATIVE mg/dL   Hgb urine dipstick NEGATIVE  NEGATIVE   Bilirubin Urine SMALL (*) NEGATIVE   Ketones, ur TRACE (*) NEGATIVE mg/dL   Protein, ur 161 (*) NEGATIVE mg/dL   Urobilinogen, UA 1.0  0.0 - 1.0 mg/dL   Nitrite POSITIVE (*) NEGATIVE   Leukocytes, UA SMALL (*) NEGATIVE  URINE MICROSCOPIC-ADD ON      Component  Value Range   Squamous Epithelial / LPF FEW (*) RARE   WBC, UA 7-10  <3 WBC/hpf   Bacteria, UA MANY (*) RARE   US Abdomen Complete  02/24/2012  *RADIOLOGY REPORT*  Clinical Data:  Right upper quadrant pain and back pain for 1 week.  COMPLETE ABDOMINAL ULTRASOUND  Comparison:  CT abdomen and pelvis 02/18/2012  Findings:  Gallbladder:  No gallstones, gallbladder wall thickening, or pericholecystic fluid.  Common bile duct:  Normal caliber with measured diameter of 6.9 mm.  Liver:  No focal lesion identified.  Within normal limits in parenchymal echogenicity.  IVC:  Appears normal.  Pancreas:  Portions of the pancreas obscured by overlying bowel gas.  Visualized portions appear unremarkable.  Spleen:  Spleen length measures 8.7 cm.  Normal parenchymal echotexture.  Right Kidney:  Right kidney measures 11.6 cm length.  No hydronephrosis.  Left Kidney:  Left kidney measures 11.5 cm length.  No hydronephrosis.  Abdominal aorta:  No aneurysm identified.  IMPRESSION: Negative abdominal ultrasound.   Original Report Authenticated By: Marlon Pel, M.D.      1. pyelnonephritis  MDM  Chronic R flank and lower back pain, with suprapubic pain x 1 month.  UA shows +UTI.  Urine culture sent, will start bactrim as treatment.  Pt has multiple scans in the past month for her sxs.  Will not reimage today.  Recommend f/u with PCP for further care and for UA recheck in 1 week.  Pt able to tolerates PO, is afebrile with stable vital sign.  No signs of systemic infection.  Urologist referral given.    BP 131/86  Pulse 88  Temp 98.3 F (36.8 C) (Oral)  Resp 16  SpO2 99%  Nursing notes reviewed and considered in documentation  Previous records reviewed and considered  All labs/vitals reviewed and considered  xrays reviewed and considered          Fayrene Helper, PA-C 03/19/12 2040

## 2012-03-22 LAB — URINE CULTURE

## 2012-03-23 NOTE — ED Notes (Signed)
+   urine Patient treated with bactrim-sensitive to same-chart appended per protocol MD. 

## 2014-03-21 ENCOUNTER — Emergency Department (HOSPITAL_COMMUNITY): Payer: Self-pay

## 2014-03-21 ENCOUNTER — Emergency Department (HOSPITAL_COMMUNITY)
Admission: EM | Admit: 2014-03-21 | Discharge: 2014-03-21 | Disposition: A | Discharge status: Home / Self Care | Payer: Self-pay | Attending: Emergency Medicine | Admitting: Emergency Medicine

## 2014-03-21 ENCOUNTER — Encounter (HOSPITAL_COMMUNITY): Payer: Self-pay | Admitting: Emergency Medicine

## 2014-03-21 DIAGNOSIS — I10 Essential (primary) hypertension: Secondary | ICD-10-CM | POA: Insufficient documentation

## 2014-03-21 DIAGNOSIS — F17200 Nicotine dependence, unspecified, uncomplicated: Secondary | ICD-10-CM | POA: Insufficient documentation

## 2014-03-21 DIAGNOSIS — R05 Cough: Secondary | ICD-10-CM

## 2014-03-21 DIAGNOSIS — R059 Cough, unspecified: Secondary | ICD-10-CM | POA: Insufficient documentation

## 2014-03-21 DIAGNOSIS — J029 Acute pharyngitis, unspecified: Secondary | ICD-10-CM | POA: Insufficient documentation

## 2014-03-21 DIAGNOSIS — R062 Wheezing: Secondary | ICD-10-CM | POA: Insufficient documentation

## 2014-03-21 MED ORDER — ALBUTEROL SULFATE (2.5 MG/3ML) 0.083% IN NEBU
5.0000 mg | INHALATION_SOLUTION | Freq: Once | RESPIRATORY_TRACT | Status: AC
  Administered 2014-03-21: 5 mg via RESPIRATORY_TRACT
  Filled 2014-03-21: qty 6

## 2014-03-21 MED ORDER — BENZONATATE 100 MG PO CAPS: 100.0000 mg | ORAL_CAPSULE | Freq: Three times a day (TID) | ORAL | Status: DC

## 2014-03-21 MED ORDER — IPRATROPIUM BROMIDE 0.02 % IN SOLN
0.5000 mg | Freq: Once | RESPIRATORY_TRACT | Status: AC
  Administered 2014-03-21: 0.5 mg via RESPIRATORY_TRACT
  Filled 2014-03-21: qty 2.5

## 2014-03-21 MED ORDER — PREDNISONE 20 MG PO TABS
40.0000 mg | ORAL_TABLET | Freq: Once | ORAL | Status: AC
  Administered 2014-03-21: 40 mg via ORAL
  Filled 2014-03-21: qty 2

## 2014-03-21 MED ORDER — PREDNISONE 20 MG PO TABS: 40.0000 mg | ORAL_TABLET | Freq: Every day | ORAL | Status: DC

## 2014-03-21 MED ORDER — ALBUTEROL SULFATE HFA 108 (90 BASE) MCG/ACT IN AERS
2.0000 | INHALATION_SPRAY | RESPIRATORY_TRACT | Status: DC | PRN
  Administered 2014-03-21: 2 via RESPIRATORY_TRACT
  Filled 2014-03-21: qty 6.7

## 2014-03-21 NOTE — ED Notes (Signed)
Pt c/o cold symptoms x 2 weeks, denies productive cough. sts when she coughs her chest feels tight. Pt reports she is up all night from coughing. sts she has had some cold chills and hot flashes at night but never checked her temperature at home. Pt reports she has had a good appetite and has been drinking a lot of water. Denies trying any OTC meds. Pt's voice is hoarse. Nad, skin warm and dry, resp e/u.

## 2014-03-21 NOTE — ED Provider Notes (Signed)
Medical screening examination/treatment/procedure(s) were performed by non-physician practitioner and as supervising physician I was immediately available for consultation/collaboration.   EKG Interpretation None        Kortlyn Koltz, MD 03/21/14 2320 

## 2014-03-21 NOTE — Discharge Instructions (Signed)
Please read and follow all provided instructions.  Your diagnoses today include:  1. Cough   2. Wheezing     Tests performed today include:  Chest x-ray - does not show any pneumonia  Vital signs. See below for your results today.   Medications prescribed:   Prednisone - steroid medicine   It is best to take this medication in the morning to prevent sleeping problems. If you are diabetic, monitor your blood sugar closely and stop taking Prednisone if blood sugar is over 300. Take with food to prevent stomach upset.    Tessalon Perles - cough suppressant medication   Albuterol inhaler - medication that opens up your airway  Use inhaler as follows: 1-2 puffs with spacer every 4 hours as needed for wheezing, cough, or shortness of breath.   Take any prescribed medications only as directed.  Home care instructions:  Follow any educational materials contained in this packet.  Follow-up instructions: Please follow-up with your primary care provider in the next 3 days for further evaluation of your symptoms and a recheck if you are not feeling better.   Return instructions:   Please return to the Emergency Department if you experience worsening symptoms.  Please return with worsening wheezing, shortness of breath, or difficulty breathing.  Return with persistent fever above 101F.   Please return if you have any other emergent concerns.  Additional Information:  Your vital signs today were: BP 171/108   Pulse 78   Temp(Src) 98.2 F (36.8 C) (Oral)   Resp 22   Ht  (1.651 m)   Wt 155 lb 3.2 oz (70.398 kg)   BMI 25.83 kg/m2   SpO2 97% If your blood pressure (BP) was elevated above 135/85 this visit, please have this repeated by your doctor within one month. --------------

## 2014-03-21 NOTE — ED Provider Notes (Signed)
CSN: [id number]     Arrival date & time 03/21/14  1652 History  This chart was scribed for non-physician practitioner, Rhea Bleacher, PA-C working with Glynn Octave, MD by Greggory Stallion, ED scribe. This patient was seen in room TR06C/TR06C and the patient's care was started at 5:29 PM.    Chief Complaint  Patient presents with  . Cough  . Sore Throat   The history is provided by the patient. No language interpreter was used.   HPI Comments: [name] is a 58 y.o. female who presents to the Emergency Department complaining of nonproductive cough that started about one week ago. States it is worse at night. Reports sore throat, night sweats and mild chest discomfort with cough. + wheezing at home. She has not taken any medications at home for her symptoms. Denies recent travel. Denies history of asthma, emphysema. Pt smokes cigarettes daily.   Past Medical History  Diagnosis Date  . Hypertension    Past Surgical History  Procedure Laterality Date  . Abdominal hysterectomy     No family history on file. History  Substance Use Topics  . Smoking status: Current Every Day Smoker  . Smokeless tobacco: Not on file  . Alcohol Use: Yes     Comment: occ   OB History   Grav Para Term Preterm Abortions TAB SAB Ect Mult Living                 Review of Systems  Constitutional: Negative for fever, chills and fatigue.  HENT: Positive for sore throat. Negative for congestion, ear pain, rhinorrhea and sinus pressure.   Eyes: Negative for redness.  Respiratory: Positive for cough, chest tightness and wheezing.   Gastrointestinal: Negative for nausea, vomiting, abdominal pain and diarrhea.  Genitourinary: Negative for dysuria.  Musculoskeletal: Negative for myalgias and neck stiffness.  Skin: Negative for rash.  Neurological: Negative for headaches.  Hematological: Negative for adenopathy.   Allergies  Review of patient's allergies indicates no known allergies.  Home  Medications   Prior to Admission medications   Not on File   BP 171/108  Pulse 78  Temp(Src) 98.2 F (36.8 C) (Oral)  Resp 22  Ht  (1.651 m)  Wt 155 lb 3.2 oz (70.398 kg)  BMI 25.83 kg/m2  SpO2 97%  Physical Exam  Nursing note and vitals reviewed. Constitutional: She is oriented to person, place, and time. She appears well-developed and well-nourished. No distress.  HENT:  Head: Normocephalic and atraumatic.  Right Ear: Tympanic membrane and ear canal normal.  Left Ear: Tympanic membrane and ear canal normal.  Nose: Nose normal.  Mouth/Throat: Oropharynx is clear and moist.  Voice is hoarse.   Eyes: Conjunctivae and EOM are normal. Right eye exhibits no discharge. Left eye exhibits no discharge.  Neck: Normal range of motion. Neck supple. No tracheal deviation present.  Cardiovascular: Normal rate, regular rhythm and normal heart sounds.   Pulmonary/Chest: Effort normal. No respiratory distress. She has wheezes.  Expiratory wheezing in all fields.  Abdominal: Soft. There is no tenderness.  Musculoskeletal: Normal range of motion.  Neurological: She is alert and oriented to person, place, and time.  Skin: Skin is warm and dry.  Psychiatric: She has a normal mood and affect. Her behavior is normal.    ED Course  Procedures (including critical care time)  DIAGNOSTIC STUDIES: Oxygen Saturation is 97% on RA, normal by my interpretation.    COORDINATION OF CARE: 5:33 PM-Discussed treatment plan which includes chest xray and  breathing treatment with pt at bedside and pt agreed to plan.   Labs Review Labs Reviewed - No data to display  Imaging Review Dg Chest 2 View  03/21/2014   CLINICAL DATA:  Cough with sore throat and chest pain for 2 weeks.  EXAM: CHEST  2 VIEW  COMPARISON:  Abdominal CT 02/18/2012. Prior chest radiographs available.  FINDINGS: The heart size and mediastinal contours are normal. The lungs are clear. There is no pleural effusion or pneumothorax.  Paraspinal osteophytes are noted throughout the thoracic spine. No acute osseous findings are evident.  IMPRESSION: No active cardiopulmonary process.   Electronically Signed   By: Roxy Horseman M.D.   On: 03/21/2014 19:09     EKG Interpretation None      Vital signs reviewed and are as follows: Filed Vitals:   03/21/14 1704  BP: 171/108  Pulse: 78  Temp: 98.2 F (36.8 C)  Resp: 22    7:49 PM Wheezing much improved.   Pt informed of x-ray results. Pt to be discharged with albuterol inhaler, prednisone, tessalon.  Patient urged to return with worsening symptoms or other concerns. Patient verbalized understanding and agrees with plan.    MDM   Final diagnoses:  Cough  Wheezing   Patient with likely viral bronchitis. No concern for PNA given normal x-ray. Antibiotics not indicated. Conservative therapy indicated. ? undignosed mild COPD. Steroids given due to wheezing.   I personally performed the services described in this documentation, which was scribed in my presence. The recorded information has been reviewed and is accurate.  Renne Crigler, PA-C 03/21/14 [phone]

## 2014-03-21 NOTE — ED Notes (Signed)
Declined W/C at D/C and was escorted to lobby by RN. 

## 2014-06-29 ENCOUNTER — Emergency Department (HOSPITAL_COMMUNITY): Payer: Self-pay

## 2014-06-29 ENCOUNTER — Encounter (HOSPITAL_COMMUNITY): Payer: Self-pay | Admitting: Emergency Medicine

## 2014-06-29 ENCOUNTER — Emergency Department (HOSPITAL_COMMUNITY)
Admission: EM | Admit: 2014-06-29 | Discharge: 2014-06-29 | Disposition: A | Discharge status: Home / Self Care | Payer: Self-pay | Attending: Emergency Medicine | Admitting: Emergency Medicine

## 2014-06-29 DIAGNOSIS — Z79899 Other long term (current) drug therapy: Secondary | ICD-10-CM | POA: Insufficient documentation

## 2014-06-29 DIAGNOSIS — B349 Viral infection, unspecified: Secondary | ICD-10-CM | POA: Insufficient documentation

## 2014-06-29 DIAGNOSIS — R059 Cough, unspecified: Secondary | ICD-10-CM

## 2014-06-29 DIAGNOSIS — I10 Essential (primary) hypertension: Secondary | ICD-10-CM | POA: Insufficient documentation

## 2014-06-29 DIAGNOSIS — R112 Nausea with vomiting, unspecified: Secondary | ICD-10-CM

## 2014-06-29 DIAGNOSIS — R05 Cough: Secondary | ICD-10-CM

## 2014-06-29 DIAGNOSIS — Z720 Tobacco use: Secondary | ICD-10-CM | POA: Insufficient documentation

## 2014-06-29 MED ORDER — ONDANSETRON 4 MG PO TBDP
4.0000 mg | ORAL_TABLET | Freq: Once | ORAL | Status: AC
  Administered 2014-06-29: 4 mg via ORAL
  Filled 2014-06-29: qty 1

## 2014-06-29 MED ORDER — ALBUTEROL SULFATE HFA 108 (90 BASE) MCG/ACT IN AERS: 1.0000 | INHALATION_SPRAY | Freq: Four times a day (QID) | RESPIRATORY_TRACT | Status: DC | PRN

## 2014-06-29 MED ORDER — PREDNISONE 20 MG PO TABS
60.0000 mg | ORAL_TABLET | Freq: Once | ORAL | Status: AC
  Administered 2014-06-29: 60 mg via ORAL
  Filled 2014-06-29: qty 3

## 2014-06-29 MED ORDER — PREDNISONE 50 MG PO TABS: 50.0000 mg | ORAL_TABLET | Freq: Every day | ORAL | Status: DC

## 2014-06-29 MED ORDER — ONDANSETRON 4 MG PO TBDP: ORAL_TABLET | ORAL | Status: DC

## 2014-06-29 MED ORDER — IPRATROPIUM-ALBUTEROL 0.5-2.5 (3) MG/3ML IN SOLN
3.0000 mL | Freq: Once | RESPIRATORY_TRACT | Status: AC
  Administered 2014-06-29: 3 mL via RESPIRATORY_TRACT
  Filled 2014-06-29: qty 3

## 2014-06-29 NOTE — ED Provider Notes (Signed)
CSN: [id number]     Arrival date & time 06/29/14  1528 History   First MD Initiated Contact with Patient 06/29/14 1544     Chief Complaint  Patient presents with  . Generalized Body Aches     (Consider location/radiation/quality/duration/timing/severity/associated sxs/prior Treatment) Patient is a 58 y.o. female presenting with cough.  Cough Cough characteristics:  Productive Sputum characteristics:  Yellow and green Severity:  Moderate Onset quality:  Gradual Duration:  1 week Timing:  Constant Progression:  Unchanged Chronicity:  New Smoker: yes   Context: upper respiratory infection   Relieved by:  Nothing Worsened by:  Nothing tried Associated symptoms: chest pain (only with coughing), chills, sinus congestion and wheezing   Associated symptoms: no fever and no shortness of breath     Past Medical History  Diagnosis Date  . Hypertension    Past Surgical History  Procedure Laterality Date  . Abdominal hysterectomy     No family history on file. History  Substance Use Topics  . Smoking status: Current Every Day Smoker  . Smokeless tobacco: Not on file  . Alcohol Use: Yes     Comment: occ   OB History    No data available     Review of Systems  Constitutional: Positive for chills. Negative for fever.  Respiratory: Positive for cough and wheezing. Negative for shortness of breath.   Cardiovascular: Positive for chest pain (only with coughing).  All other systems reviewed and are negative.     Allergies  Review of patient's allergies indicates no known allergies.  Home Medications   Prior to Admission medications   Medication Sig Start Date End Date Taking? Authorizing Provider  naphazoline-glycerin (CLEAR EYES) 0.012-0.2 % SOLN Place 1 drop into both eyes every morning. For dry eyes   Yes Historical Provider, MD  albuterol (PROVENTIL HFA;VENTOLIN HFA) 108 (90 BASE) MCG/ACT inhaler Inhale 1-2 puffs into the lungs every 6 (six) hours as needed for  wheezing or shortness of breath. 06/29/14   Mirian MoMatthew Jamesyn Moorefield, MD  benzonatate (TESSALON) 100 MG capsule Take 1 capsule (100 mg total) by mouth every 8 (eight) hours. Patient not taking: Reported on 06/29/2014 03/21/14   Renne CriglerJoshua Geiple, PA-C  cefUROXime (CEFTIN) 500 MG tablet Take 500 mg by mouth 2 (two) times daily with a meal.    Historical Provider, MD  ondansetron (ZOFRAN ODT) 4 MG disintegrating tablet 4mg  ODT q4 hours prn nausea/vomit 06/29/14   Mirian MoMatthew Anylah Scheib, MD  predniSONE (DELTASONE) 50 MG tablet Take 1 tablet (50 mg total) by mouth daily. 06/29/14   Mirian MoMatthew Kanae Ignatowski, MD   BP 149/83 mmHg  Pulse 95  Temp(Src) 99.1 F (37.3 C) (Oral)  Resp 16  SpO2 93% Physical Exam  Constitutional: She is oriented to person, place, and time. She appears well-developed and well-nourished.  HENT:  Head: Normocephalic and atraumatic.  Right Ear: External ear normal.  Left Ear: External ear normal.  Eyes: Conjunctivae and EOM are normal. Pupils are equal, round, and reactive to light.  Neck: Normal range of motion. Neck supple.  Cardiovascular: Normal rate, regular rhythm, normal heart sounds and intact distal pulses.   Pulmonary/Chest: Effort normal. She has wheezes (diffusely).  Abdominal: Soft. Bowel sounds are normal. There is no tenderness.  Musculoskeletal: Normal range of motion.  Neurological: She is alert and oriented to person, place, and time.  Skin: Skin is warm and dry.  Vitals reviewed.   ED Course  Procedures (including critical care time) Labs Review Labs Reviewed - No data to display  Imaging Review Dg Chest 2 View  06/29/2014   CLINICAL DATA:  Shortness of breath, cough and congestion 1 week.  EXAM: CHEST  2 VIEW  COMPARISON:  03/21/2014  FINDINGS: Lungs are clear. Cardiomediastinal silhouette is within normal. There is minimal degenerative change of the spine.  IMPRESSION: No active cardiopulmonary disease.   Electronically Signed   By: Elberta Fortisaniel  Boyle M.D.   On: 06/29/2014 16:14      EKG Interpretation None      MDM   Final diagnoses:  Cough  Non-intractable vomiting with nausea, vomiting of unspecified type  Viral syndrome    58 y.o. female with pertinent PMH of HTN, prior wheezing presents with cough, vomiting, malaise x 1 week.  No measured fevers at home. Patient endorses green productive cough. Symptoms primarily worse when laying flat and associated with significant rhinorrhea. On arrival today vitals signs and physical exam as above. Chest x-ray unremarkable. Likely viral syndrome, however echo prior providers concern for potential early COPD. Given duoneb here.  Discussed importance of follow-up with PCP with patient at length. Discharged home with steroid course, albuterol, Zofran. Discharged home with standard return precautions.    I have reviewed all laboratory and imaging studies if ordered as above  1. Cough   2. Non-intractable vomiting with nausea, vomiting of unspecified type   3. Viral syndrome         Mirian MoMatthew Dany Harten, MD 06/29/14 [phone]

## 2014-06-29 NOTE — ED Notes (Signed)
Pt presents with productive cough, fever, and chills for the past week.  Denies taking medications at home.

## 2014-06-29 NOTE — Discharge Instructions (Signed)
Cough, Adult ° A cough is a reflex that helps clear your throat and airways. It can help heal the body or may be a reaction to an irritated airway. A cough may only last 2 or 3 weeks (acute) or may last more than 8 weeks (chronic).  °CAUSES °Acute cough: °· Viral or bacterial infections. °Chronic cough: °· Infections. °· Allergies. °· Asthma. °· Post-nasal drip. °· Smoking. °· Heartburn or acid reflux. °· Some medicines. °· Chronic lung problems (COPD). °· Cancer. °SYMPTOMS  °· Cough. °· Fever. °· Chest pain. °· Increased breathing rate. °· High-pitched whistling sound when breathing (wheezing). °· Colored mucus that you cough up (sputum). °TREATMENT  °· A bacterial cough may be treated with antibiotic medicine. °· A viral cough must run its course and will not respond to antibiotics. °· Your caregiver may recommend other treatments if you have a chronic cough. °HOME CARE INSTRUCTIONS  °· Only take over-the-counter or prescription medicines for pain, discomfort, or fever as directed by your caregiver. Use cough suppressants only as directed by your caregiver. °· Use a cold steam vaporizer or humidifier in your bedroom or home to help loosen secretions. °· Sleep in a semi-upright position if your cough is worse at night. °· Rest as needed. °· Stop smoking if you smoke. °SEEK IMMEDIATE MEDICAL CARE IF:  °· You have pus in your sputum. °· Your cough starts to worsen. °· You cannot control your cough with suppressants and are losing sleep. °· You begin coughing up blood. °· You have difficulty breathing. °· You develop pain which is getting worse or is uncontrolled with medicine. °· You have a fever. °MAKE SURE YOU:  °· Understand these instructions. °· Will watch your condition. °· Will get help right away if you are not doing well or get worse. °Document Released: 12/20/2010 Document Revised: 09/15/2011 Document Reviewed: 12/20/2010 °ExitCare® Patient Information ©2015 [employer]. This information is not intended  to replace advice given to you by your health care provider. Make sure you discuss any questions you have with your health care provider. ° °Emergency Department Resource Guide °1) Find a Doctor and Pay Out of Pocket °Although you won't have to find out who is covered by your insurance plan, it is a good idea to ask around and get recommendations. You will then need to call the office and see if the doctor you have chosen will accept you as a new patient and what types of options they offer for patients who are self-pay. Some doctors offer discounts or will set up payment plans for their patients who do not have insurance, but you will need to ask so you aren't surprised when you get to your appointment. ° °2) Contact Your Local Health Department °Not all health departments have doctors that can see patients for sick visits, but many do, so it is worth a call to see if yours does. If you don't know where your local health department is, you can check in your phone book. The CDC also has a tool to help you locate your state's health department, and many state websites also have listings of all of their local health departments. ° °3) Find a Walk-in Clinic °If your illness is not likely to be very severe or complicated, you may want to try a walk in clinic. These are popping up all over the country in pharmacies, drugstores, and shopping centers. They're usually staffed by nurse practitioners or physician assistants that have been trained to treat common illnesses   and complaints. They're usually fairly quick and inexpensive. However, if you have serious medical issues or chronic medical problems, these are probably not your best option. ° °No Primary Care Doctor: °- Call [facility] at  832-8000 - they can help you locate a primary care doctor that  accepts your insurance, provides certain services, etc. °- Physician Referral Service- 1-800-533-3463 ° °Chronic Pain Problems: °Organization         Address  Phone    Notes  °[facility]  (336) 297-2271 Patients need to be referred by their primary care doctor.  ° °Medication Assistance: °Organization         Address  Phone   Notes  °[place] [facility] [address] °[place], [place] [address] (336) 641-8030 --Must be a resident of [place] °-- Must have NO insurance coverage whatsoever (no Medicaid/ Medicare, etc.) °-- The pt. MUST have a primary care doctor that directs their care regularly and follows them in the community °  °[facility]  (866) 331-1348   °[employer]  (888) 892-1162   ° °Agencies that provide inexpensive medical care: °Organization         Address  Phone   Notes  °[facility]  (336) 832-8035   °[facility]    (336) 832-7272   °[facility] [address] °[place], [place] [address] (336) 832-4777   °[facility] [address], °[place] (336) 271-4999   °[facility]    (336) 373-0678   °[facility]    (336) 272-1050   °[facility] ° [address], [place] Phone:  (336) 832-4444, Fax:  (336) 832-4440 Hours of Operation:  9 am - 6 pm, M-F.  Also accepts Medicaid/Medicare and self-pay.  °[facility] ° [address], [place] Phone: (336) 832-3150, Fax: (336) 832-3151. Hours of Operation:  8:30 am - 5:30 pm, M-F.  Also accepts Medicaid and self-pay.  °[facility] [place] [address], [place] Phone: (336) 878-6027   °[facility] [address], [place], [place] (336)723-1848, Ext. 123 Mondays & Thursdays: 7-9 AM.  First 15 patients are seen on a first come, first serve basis. °  ° °Medicaid-accepting [place] Providers: ° °Organization         Address  Phone   Notes  °[facility] [address], [place] (336) 641-2100 Also accepts self-pay patients.  °[facility]  [address], [place] ° (336) 856-9996   °[facility] [address], [place] (336) 288-8857   °[facility] [address] [place] [address], [place] (336) 299-7000   °Veita Bland [address], [place]  ° (336) 373-1557 Only accepts [place] Access Medicaid patients after they have their name applied to their card.  ° °Self-Pay (no insurance) in [place]: ° °Organization         Address  Phone   Notes  °Sickle Cell Patients, [facility] [address], [place] (336) 832-1970   °[facility] [address], [place] (336) 832-4400   °[facility] ° [address], [place] (336) 992-4800   °[facility]/Dr. Osei-Bonsu ° [address] [place] [address], [place] or [address], [place] (336) 841-8500 Phone number for both [place] and [place] locations is the same.  °[facility] [address], [place] (336) 299-0000   °[facility] [address] [place] [address], [place] or [address] (336) 852-7530 °(336) 878-2260   °[facility] [address], [place] (336) 350-1642, phone; (336) 294-5005, fax Sees patients 1st and 3rd Saturday of every month.  Must not qualify for public or private insurance (i.e. Medicaid, Medicare, [place] Health Choice, Veterans' Benefits) • Household income should be no more than 200% of the poverty level •The clinic cannot treat you if you are pregnant or think you are pregnant • Sexually transmitted diseases are not treated at the clinic.  ° ° °Dental Care: °Organization         Address  Phone  Notes  °[facility] [address], [place] (336) 641-6152 Accepts children up to age 21 who are enrolled in Medicaid or [place] Health Choice; pregnant women with a Medicaid card; and children who have  applied for Medicaid or [place] Health Choice, but were declined, whose parents can pay a reduced fee at time of service.  °[facility] [place]  [address], [place] (336) 641-7733 Accepts children up to age 21 who are enrolled in Medicaid or [place] Health Choice; pregnant women with a Medicaid card; and children who have applied for Medicaid or [place] Health Choice, but were declined, whose parents can pay a reduced fee at time of service.  °[facility] ° [address], [place] (336) 641-4533 Patients are seen by appointment only. Walk-ins are not accepted. [facility] will see patients 18 years of age and older. °Monday - Tuesday (8am-5pm) °Most Wednesdays (8:30-5pm) °$30 per visit, cash only  °[facility] ° [address], [place] (336) 641-4533 Patients are seen by appointment only. Walk-ins are not accepted. [facility] will see patients 18 years of age and older. °One Wednesday Evening (Monthly: Volunteer Based).  $30 per visit, cash only  °[facility]  (919) 537-3737 for adults; Children under age 4, call Graduate Pediatric Dentistry at ([phone]) [phone]. Children aged 4-14, please call (919) [phone] to request a pediatric application. ° Dental services are provided in all areas of dental care including fillings, crowns and bridges, complete and partial dentures, implants, gum treatment, root canals, and extractions. Preventive care is also provided. Treatment is provided to both adults and children. °Patients are selected via a lottery and there is often a waiting list. °  °[facility] [address], ° ° (336) 763-8833 [email or web address] °  °[facility] [address], [place], [place] (336)723-1848, Ext. 123 Second and Fourth Thursday of each month, opens at 6:30 AM; Clinic ends at 9 AM.  Patients are seen on a first-come first-served basis, and a  limited number are seen during each clinic.  ° °[facility] ° [address], [place], [place] (336) 723-7904   Eligibility Requirements °You must have lived in [place], [place], or [place] counties for at least the last three months. °  You cannot be eligible for state or federal sponsored healthcare insurance, including Veterans Administration, Medicaid, or Medicare. °  You generally cannot be eligible for healthcare insurance through your employer.  °  How to apply: °Eligibility screenings are held every Tuesday and Wednesday   afternoon from 1:00 pm until 4:00 pm. You do not need an appointment for the interview!  °[facility] [address], [place], [place] 336-631-2330   °[facility]  336-342-8273   °[facility]  336-703-3100   °[facility]  336-570-6415   ° °Behavioral Health Resources in the Community: °Intensive Outpatient Programs °Organization         Address  Phone  Notes  °[facility] [address], [place], [place] 336-878-6098   °[facility] [address], [place], [place] 336-832-9800   °[facility] [address], [place], [place] ° 336-882-2125   °[facility] [address],  °[place], [place] 1-800-853-5163 or 336-641-4981   °Substance Abuse Resources °Organization         Address  Phone  Notes  °[facility]  336-882-2125   °[facility]  336-784-9470   °[facility] [place] [facility]  336-285-9073   °[facility]  336-845-3988   °Residential & Outpatient Substance Abuse Program  1-800-659-3381   °Psychological Services °Organization         Address  Phone  Notes  °[facility]  336- 832-9600   °[facility]  336- 378-7881   °[facility] [address], [place] 1-800-853-5163 or 336-641-4981   ° °Mobile Crisis Teams °Organization          Address  Phone  Notes  °[facility], Mobile Crisis Care Unit  1-877-626-1772   °Assertive °[facility] ° [address] [place], [place] 336-834-9664   °Sharon DeEsch [address] °[place] 336-554-5454   ° °Self-Help/Support Groups °Organization         Address  Phone             Notes  °[facility] - variety of support groups  336- 373-1402 Call for more information  °Narcotics Anonymous (NA), [facility] [address], °[place]  2 meetings at this location  ° °Residential Treatment Programs °Organization         Address  Phone  Notes  °[facility] [address],    °[place]  1-866-801-8205   °[facility] ° [address], [place], [place] 704-293-8524   °[facility] [address], [place] 336-845-3988 Admissions: 8am-3pm M-F  °[facility] [address]    °[place], [place] 336-841-1104   °[facility] [address], [place], [place] 336-379-7146   °[facility] [place] [facility] [address]  °, [place] 336-285-9073   °[facility] - Intensive Outpatient [address], , [place] 336-852-3033   °[facility] [address]  °[place], [place] 1-877-615-2722 or 336-784-9470   °[facility] [address] , [place] 336-227-7417 Accepts Medicaid  °[facility] [address]  ° [place] 1-800-659-3381 Substance Abuse/Addiction Treatment  ° °[place] Behavioral Health Resources °Organization         Address  Phone  Notes  °[facility]  (888) 581-9988   °Julie Brannon, PhD [address] [place], [place]   (336) 349-5553 or (336) 951-0000   °[facility]   [address] °[place],  (336) 349-4454   °[facility] [address], [place],  (336) 342-8316 Insurance/Medicaid/sponsorship  through [facility]  °[facility] [address]                                    [place], [place] (336) 342-8316 Therapy/tele-psych/case  °[facility] [address]  ° [place], [place] (336) 349-2233    °Dr. Arfeen  (336) 349-4544   °[facility] [place]  [employer] [place] [facility] 1) [address],  °2) [address], [place] °3)  [address], [place] (336) 349-3220 °(336) 342-7768 ° °(336) 342-8140   °[place] [facility] (336) 342-1394 or (336) 342-3537 (After Hours)    ° ° °

## 2014-06-29 NOTE — ED Notes (Signed)
Patient transported to X-ray 

## 2016-04-04 ENCOUNTER — Emergency Department (HOSPITAL_COMMUNITY)
Admission: EM | Admit: 2016-04-04 | Discharge: 2016-04-05 | Disposition: A | Discharge status: Home / Self Care | Payer: Self-pay | Attending: Emergency Medicine | Admitting: Emergency Medicine

## 2016-04-04 ENCOUNTER — Emergency Department (HOSPITAL_COMMUNITY): Payer: Self-pay

## 2016-04-04 ENCOUNTER — Encounter (HOSPITAL_COMMUNITY): Payer: Self-pay | Admitting: *Deleted

## 2016-04-04 DIAGNOSIS — F17200 Nicotine dependence, unspecified, uncomplicated: Secondary | ICD-10-CM | POA: Insufficient documentation

## 2016-04-04 DIAGNOSIS — N390 Urinary tract infection, site not specified: Secondary | ICD-10-CM | POA: Insufficient documentation

## 2016-04-04 DIAGNOSIS — R109 Unspecified abdominal pain: Secondary | ICD-10-CM

## 2016-04-04 DIAGNOSIS — R319 Hematuria, unspecified: Secondary | ICD-10-CM | POA: Insufficient documentation

## 2016-04-04 DIAGNOSIS — I10 Essential (primary) hypertension: Secondary | ICD-10-CM | POA: Insufficient documentation

## 2016-04-04 DIAGNOSIS — R911 Solitary pulmonary nodule: Secondary | ICD-10-CM | POA: Insufficient documentation

## 2016-04-04 LAB — URINALYSIS, ROUTINE W REFLEX MICROSCOPIC
Bilirubin Urine: NEGATIVE
Glucose, UA: NEGATIVE mg/dL
Ketones, ur: NEGATIVE mg/dL
Leukocytes, UA: NEGATIVE
Nitrite: POSITIVE — AB
Protein, ur: NEGATIVE mg/dL
Specific Gravity, Urine: 1.019 (ref 1.005–1.030)
pH: 5.5 (ref 5.0–8.0)

## 2016-04-04 LAB — CBC WITH DIFFERENTIAL/PLATELET
BASOS PCT: 0 %
Basophils Absolute: 0 10*3/uL (ref 0.0–0.1)
EOS ABS: 0.1 10*3/uL (ref 0.0–0.7)
EOS PCT: 1 %
HCT: 38.6 % (ref 36.0–46.0)
Hemoglobin: 13.3 g/dL (ref 12.0–15.0)
LYMPHS PCT: 23 %
Lymphs Abs: 1.7 10*3/uL (ref 0.7–4.0)
MCH: 32.4 pg (ref 26.0–34.0)
MCHC: 34.5 g/dL (ref 30.0–36.0)
MCV: 93.9 fL (ref 78.0–100.0)
Monocytes Absolute: 0.5 10*3/uL (ref 0.1–1.0)
Monocytes Relative: 7 %
Neutro Abs: 5.4 10*3/uL (ref 1.7–7.7)
Neutrophils Relative %: 69 %
PLATELETS: 214 10*3/uL (ref 150–400)
RBC: 4.11 MIL/uL (ref 3.87–5.11)
RDW: 12.9 % (ref 11.5–15.5)
WBC: 7.7 10*3/uL (ref 4.0–10.5)

## 2016-04-04 LAB — COMPREHENSIVE METABOLIC PANEL
ALBUMIN: 4.3 g/dL (ref 3.5–5.0)
ALT: 19 U/L (ref 14–54)
AST: 26 U/L (ref 15–41)
Alkaline Phosphatase: 76 U/L (ref 38–126)
Anion gap: 11 (ref 5–15)
BUN: 11 mg/dL (ref 6–20)
CHLORIDE: 108 mmol/L (ref 101–111)
CO2: 20 mmol/L — AB (ref 22–32)
Calcium: 9.2 mg/dL (ref 8.9–10.3)
Creatinine, Ser: 0.59 mg/dL (ref 0.44–1.00)
GFR calc Af Amer: >60 mL/min (ref >60)
GFR calc non Af Amer: >60 mL/min (ref >60)
Glucose, Bld: 92 mg/dL (ref 65–99)
Potassium: 3.4 mmol/L — ABNORMAL LOW (ref 3.5–5.1)
SODIUM: 139 mmol/L (ref 135–145)
Total Bilirubin: 0.8 mg/dL (ref 0.3–1.2)
Total Protein: 7.9 g/dL (ref 6.5–8.1)

## 2016-04-04 LAB — I-STAT TROPONIN, ED
Troponin i, poc: 0 ng/mL (ref 0.00–0.08)
Troponin i, poc: 0 ng/mL (ref 0.00–0.08)

## 2016-04-04 LAB — URINE MICROSCOPIC-ADD ON

## 2016-04-04 LAB — I-STAT CG4 LACTIC ACID, ED
LACTIC ACID, VENOUS: 0.71 mmol/L (ref 0.5–1.9)
Lactic Acid, Venous: 1.04 mmol/L (ref 0.5–1.9)

## 2016-04-04 LAB — LIPASE, BLOOD: Lipase: 26 U/L (ref 11–51)

## 2016-04-04 LAB — D-DIMER, QUANTITATIVE (NOT AT ARMC): D DIMER QUANT: 0.53 ug{FEU}/mL — AB (ref 0.00–0.50)

## 2016-04-04 MED ORDER — HYDROMORPHONE HCL 1 MG/ML IJ SOLN
1.0000 mg | Freq: Once | INTRAMUSCULAR | Status: AC
  Administered 2016-04-04: 1 mg via INTRAVENOUS
  Filled 2016-04-04: qty 1

## 2016-04-04 MED ORDER — MORPHINE SULFATE (PF) 4 MG/ML IV SOLN
4.0000 mg | Freq: Once | INTRAVENOUS | Status: AC
  Administered 2016-04-04: 4 mg via INTRAVENOUS
  Filled 2016-04-04: qty 1

## 2016-04-04 MED ORDER — ONDANSETRON HCL 4 MG/2ML IJ SOLN
4.0000 mg | Freq: Once | INTRAMUSCULAR | Status: AC
  Administered 2016-04-04: 4 mg via INTRAVENOUS
  Filled 2016-04-04: qty 2

## 2016-04-04 MED ORDER — SODIUM CHLORIDE 0.9 % IV BOLUS (SEPSIS)
1000.0000 mL | Freq: Once | INTRAVENOUS | Status: AC
  Administered 2016-04-04: 1000 mL via INTRAVENOUS

## 2016-04-04 MED ORDER — IOPAMIDOL (ISOVUE-370) INJECTION 76%
INTRAVENOUS | Status: AC
  Administered 2016-04-04: 100 mL
  Filled 2016-04-04: qty 100

## 2016-04-04 NOTE — ED Provider Notes (Signed)
[facility] DEPT Provider Note   CSN: [id number] Arrival date & time: 04/04/16  1426   History   Chief Complaint Chief Complaint  Patient presents with  . Back Pain  . Nausea    HPI [name] is a 60 y.o. female with a past medical history significant for hypertension and kidney stones who presents with right flank pain. Patient reports that this feels similar to when she has had a kidney stone in the past. She says that her discomfort has been going on for the last several days and has worsened in severity. She describes the pain as greater than 10 out of 10 in her right flank. Patient denies radiation of the pain, denies constipation, diarrhea or dysuria. Patient does report some nausea but no vomiting.  On review of systems, patient does report that she has had some intermittent chest pain and shortness of breath for approximately one year. She says that she has not experienced it today but does security frequently. She denies associated diaphoresis, hemoptysis, or syncope. She does say that she feels lightheaded occasionally. She denies any history of DVT or PE.  The history is provided by the patient and medical records. No language interpreter was used.  Flank Pain  This is a recurrent problem. The current episode started more than 2 days ago. The problem occurs constantly. The problem has been gradually worsening. Pertinent negatives include no chest pain, no abdominal pain, no headaches and no shortness of breath. The symptoms are aggravated by twisting and walking. Nothing relieves the symptoms. She has tried nothing for the symptoms. The treatment provided no relief.    Past Medical History:  Diagnosis Date  . Hypertension     There are no active problems to display for this patient.   Past Surgical History:  Procedure Laterality Date  . ABDOMINAL HYSTERECTOMY      OB History    No data available       Home Medications    Prior to Admission  medications   Medication Sig Start Date End Date Taking? Authorizing Provider  albuterol (PROVENTIL HFA;VENTOLIN HFA) 108 (90 BASE) MCG/ACT inhaler Inhale 1-2 puffs into the lungs every 6 (six) hours as needed for wheezing or shortness of breath. 06/29/14   Mirian Mo, MD  benzonatate (TESSALON) 100 MG capsule Take 1 capsule (100 mg total) by mouth every 8 (eight) hours. Patient not taking: Reported on 06/29/2014 03/21/14   Renne Crigler, PA-C  cefUROXime (CEFTIN) 500 MG tablet Take 500 mg by mouth 2 (two) times daily with a meal.    Historical Provider, MD  naphazoline-glycerin (CLEAR EYES) 0.012-0.2 % SOLN Place 1 drop into both eyes every morning. For dry eyes    Historical Provider, MD  ondansetron (ZOFRAN ODT) 4 MG disintegrating tablet 4mg  ODT q4 hours prn nausea/vomit 06/29/14   Mirian Mo, MD  predniSONE (DELTASONE) 50 MG tablet Take 1 tablet (50 mg total) by mouth daily. 06/29/14   Mirian Mo, MD    Family History History reviewed. No pertinent family history.  Social History Social History  Substance Use Topics  . Smoking status: Current Every Day Smoker  . Smokeless tobacco: Not on file  . Alcohol use Yes     Comment: occ     Allergies   Review of patient's allergies indicates no known allergies.   Review of Systems Review of Systems  Constitutional: Negative for chills and fever.  HENT: Negative for congestion and rhinorrhea.   Eyes: Negative for visual disturbance.  Respiratory: Negative for cough, chest tightness, shortness of breath and stridor.   Cardiovascular: Negative for chest pain and palpitations.  Gastrointestinal: Positive for nausea. Negative for abdominal pain, diarrhea and vomiting.  Genitourinary: Positive for flank pain. Negative for dysuria, vaginal bleeding, vaginal discharge and vaginal pain.  Musculoskeletal: Positive for back pain. Negative for neck pain and neck stiffness.  Neurological: Negative for light-headedness, numbness and  headaches.  Psychiatric/Behavioral: Negative for agitation.  All other systems reviewed and are negative.    Physical Exam Updated Vital Signs BP 170/83   Pulse 74   Temp 98.3 F (36.8 C) (Oral)   Resp 18   SpO2 100%   Physical Exam  Constitutional: She is oriented to person, place, and time. She appears well-developed and well-nourished. No distress.  HENT:  Head: Normocephalic and atraumatic.  Mouth/Throat: Oropharynx is clear and moist. No oropharyngeal exudate.  Eyes: Conjunctivae are normal.  Neck: Normal range of motion. Neck supple.  Cardiovascular: Normal rate and regular rhythm.   No murmur heard. Pulmonary/Chest: Effort normal and breath sounds normal. No stridor. No respiratory distress. She has no wheezes. She exhibits no tenderness.  Abdominal: Soft. Normal appearance. There is no tenderness. There is CVA tenderness.  Musculoskeletal: She exhibits tenderness. She exhibits no edema.       Lumbar back: She exhibits tenderness.       Back:  Neurological: She is alert and oriented to person, place, and time. She displays normal reflexes. No cranial nerve deficit. She exhibits normal muscle tone. Coordination normal.  Skin: Skin is warm and dry. Capillary refill takes less than 2 seconds. She is not diaphoretic.  Psychiatric: She has a normal mood and affect.  Nursing note and vitals reviewed.    ED Treatments / Results  Labs (all labs ordered are listed, but only abnormal results are displayed) Labs Reviewed  URINALYSIS, ROUTINE W REFLEX MICROSCOPIC (NOT AT [facility]) - Abnormal; Notable for the following:       Result Value   APPearance HAZY (*)    Hgb urine dipstick TRACE (*)    Nitrite POSITIVE (*)    All other components within normal limits  URINE MICROSCOPIC-ADD ON - Abnormal; Notable for the following:    Squamous Epithelial / LPF 6-30 (*)    Bacteria, UA MANY (*)    All other components within normal limits  COMPREHENSIVE METABOLIC PANEL - Abnormal;  Notable for the following:    Potassium 3.4 (*)    CO2 20 (*)    All other components within normal limits  D-DIMER, QUANTITATIVE (NOT AT [facility]) - Abnormal; Notable for the following:    D-Dimer, Quant 0.53 (*)    All other components within normal limits  URINE CULTURE  CBC WITH DIFFERENTIAL/PLATELET  LIPASE, BLOOD  I-STAT CG4 LACTIC ACID, ED  I-STAT TROPOININ, ED  I-STAT CG4 LACTIC ACID, ED  Rosezena SensorI-STAT TROPOININ, ED    EKG  EKG Interpretation None       Radiology Dg Chest 2 View  Result Date: 04/04/2016 CLINICAL DATA:  47Sixty -year-old female with mid chest pain and shortness of breath today. Initial encounter. EXAM: CHEST  2 VIEW COMPARISON:  06/29/2014 and earlier. FINDINGS: Stable cardiac size at the upper limits of normal. Other mediastinal contours are within normal limits. Visualized tracheal air column is within normal limits. Lung parenchyma is stable and clear. No pneumothorax or pleural effusion. No acute osseous abnormality identified. Negative visible bowel gas pattern. IMPRESSION: No acute cardiopulmonary abnormality. Electronically Signed   By:  Odessa Fleming M.D.   On: 04/04/2016 19:59   Ct Angio Chest Pe W And/or Wo Contrast  Result Date: 04/04/2016 CLINICAL DATA:  Shortness of breath and chest pain, dizziness and nausea, since Monday. EXAM: CT ANGIOGRAPHY CHEST WITH CONTRAST TECHNIQUE: Multidetector CT imaging of the chest was performed using the standard protocol during bolus administration of intravenous contrast. Multiplanar CT image reconstructions and MIPs were obtained to evaluate the vascular anatomy. CONTRAST:  100 mL Isovue 370 COMPARISON:  None. FINDINGS: Cardiovascular: Satisfactory opacification of the pulmonary arteries to the segmental level. No evidence of pulmonary embolism. Normal heart size. No pericardial effusion. Mediastinum/Nodes: No enlarged mediastinal, hilar, or axillary lymph nodes. Thyroid gland, trachea, and esophagus demonstrate no significant  findings. Lungs/Pleura: Mild atelectasis in the lung bases. In the right lung base there is a 6 x 9 mm nodule (8 mm mean diameter) on image 84 of series 5. This nodule is enlarging since the previous CT abdomen and pelvis from 02/18/2012. Additional smaller nodules are present without significant change. No focal airspace disease or consolidation in the lungs. No pleural effusions. No pneumothorax. Upper Abdomen: No acute abnormality. Musculoskeletal: Degenerative changes in the spine. No destructive bone lesions. Review of the MIP images confirms the above findings. IMPRESSION: No evidence of significant pulmonary embolus. **An incidental finding of potential clinical significance has been found. Enlarging nodule in the right lung base, now measuring 8 mm mean diameter compared with 4.6 mm previously. Due to interval enlargement, consider PET-CT scan for further evaluation. Otherwise, a six-month follow-up study would be useful.** Electronically Signed   By: Burman Nieves M.D.   On: 04/04/2016 22:53   Ct Renal Stone Study  Result Date: 04/05/2016 CLINICAL DATA:  Right flank pain since Thursday.  Hematuria. EXAM: CT ABDOMEN AND PELVIS WITHOUT CONTRAST TECHNIQUE: Multidetector CT imaging of the abdomen and pelvis was performed following the standard protocol without IV contrast. COMPARISON:  02/18/2012 FINDINGS: Lower chest: Mild dependent atelectasis in the lung bases. Small nodules in the lung bases. See additional report from CT chest earlier today. Hepatobiliary: No focal liver abnormality is seen. No gallstones, gallbladder wall thickening, or biliary dilatation. Pancreas: Unremarkable. No pancreatic ductal dilatation or surrounding inflammatory changes. Spleen: Normal in size without focal abnormality. Adrenals/Urinary Tract: No adrenal gland nodules. Residual contrast material within renal collecting systems, ureters, and the bladder. Due to residual contrast material, cannot exclude nonobstructing  ureteral or intrarenal or bladder stones. However, there is no hydronephrosis or hydroureter and no bladder wall thickening is appreciated. Stomach/Bowel: Stomach is within normal limits. Appendix appears normal. No evidence of bowel wall thickening, distention, or inflammatory changes. Scattered diverticula in the sigmoid colon. Vascular/Lymphatic: No significant vascular findings are present. No enlarged abdominal or pelvic lymph nodes. Reproductive: Status post hysterectomy. No adnexal masses. Other: No abdominal wall hernia or abnormality. No abdominopelvic ascites. Musculoskeletal: Spondylolysis with mild spondylolisthesis at L4-5. Mild degenerative changes in the lumbar spine. No destructive bone lesions. IMPRESSION: No evidence of renal or ureteral obstruction. Cannot exclude nonobstructing stones due to presence of residual contrast material in the urinary system from previous CT chest. Appendix is normal. Diverticulosis of sigmoid colon without evidence of diverticulitis. Electronically Signed   By: Burman Nieves M.D.   On: 04/05/2016 00:55    Procedures Procedures (including critical care time)  Medications Ordered in ED Medications  sodium chloride 0.9 % bolus 1,000 mL (0 mLs Intravenous Stopped 04/04/16 2101)  morphine 4 MG/ML injection 4 mg (4 mg Intravenous Given 04/04/16 1957)  ondansetron [facility]) injection 4 mg (4 mg Intravenous Given 04/04/16 1957)  HYDROmorphone (DILAUDID) injection 1 mg (1 mg Intravenous Given 04/04/16 2153)  iopamidol (ISOVUE-370) 76 % injection (100 mLs  Contrast Given 04/04/16 2133)  HYDROmorphone (DILAUDID) injection 1 mg (1 mg Intravenous Given 04/04/16 2328)  cefTRIAXone (ROCEPHIN) 1 g in dextrose 5 % 50 mL IVPB (1 g Intravenous New Bag/Given 04/05/16 0051)     Initial Impression / Assessment and Plan / ED Course  I have reviewed the triage vital signs and the nursing notes.  Pertinent labs & imaging results that were available during my care of the patient  were reviewed by me and considered in my medical decision making (see chart for details).  Clinical Course    [name] is a 60 y.o. female with a past medical history significant for hypertension and kidney stones who presents with a several day history of right flank pain and a 1 year history of chest pain and shortness of breath. History and exam are seen above.  Given patient's history of kidney stones, similar symptoms, and right flank pain, suspect nephrolithiasis as etiology of discomfort. Patient will have urinalysis, laboratory testing, and CT stone study to look for large kidney stone.   Patient given pain medicine, nausea medicine and fluids for symptomatic management.  For the patient's long history of chest pain and shortness of breath with some associated lightheadedness, patient will have laboratory testing and chest x-ray. Results of workup are seen above. Patient found to have elevated d-dimer. CT PE study was ordered.  PE study showed no evidence of pulmonary embolism however, a lung nodule had grown from prior. Patient was informed of this finding instructed to follow-up for further management and observation of the nodule.   Patient's urinalysis showed trace hemoglobin suggesting possible kidney stone. Patient did have urinalysis showing nitrites and many bacteria however, squamous epithelial cells were also prevalent.  Troponin negative, lactic acid nonelevated, and lipase was normal. No evidence of leukocytosis or anemia.  Pt awaiting results of stone study.   Stone study was obscured by residual contrast from PE scan. There was no evidence of renal or ureteral obstruction.  Patient given Rocephin for possible UTI with the nitrites and bacteria. Given absence of large obstructing stone, patient felt appropriate for discharge. Patient felt better after pain and nausea medicine. Patient will be given prescription for antibiotics and instructions to follow-up with  both PCP and likely urology for her hematuria. Patient also instructed to follow-up with PCP for further management of her pulmonary nodule. Given strict return precautions for any signs and symptoms of pyelonephritis or worsening symptoms. Urine cultures were sent.  Patient had no other questions or concerns and was discharged in good condition.    Final Clinical Impressions(s) / ED Diagnoses   Final diagnoses:  UTI (lower urinary tract infection)  Flank pain  Hematuria  Pulmonary nodule    New Prescriptions New Prescriptions   CEPHALEXIN (KEFLEX) 500 MG CAPSULE    Take 1 capsule (500 mg total) by mouth 2 (two) times daily.   ONDANSETRON (ZOFRAN) 4 MG TABLET    Take 1 tablet (4 mg total) by mouth every 6 (six) hours.   OXYCODONE-ACETAMINOPHEN (PERCOCET/ROXICET) 5-325 MG TABLET    Take 1 tablet by mouth every 4 (four) hours as needed for severe pain.    Clinical Impression: 1. UTI (lower urinary tract infection)   2. Flank pain   3. Hematuria   4. Pulmonary nodule  Disposition: Discharge  Condition: Good  I have discussed the results, Dx and Tx plan with the pt(& family if present). He/she/they expressed understanding and agree(s) with the plan. Discharge instructions discussed at great length. Strict return precautions discussed and pt &/or family have verbalized understanding of the instructions. No further questions at time of discharge.    New Prescriptions   CEPHALEXIN (KEFLEX) 500 MG CAPSULE    Take 1 capsule (500 mg total) by mouth 2 (two) times daily.   ONDANSETRON (ZOFRAN) 4 MG TABLET    Take 1 tablet (4 mg total) by mouth every 6 (six) hours.   OXYCODONE-ACETAMINOPHEN (PERCOCET/ROXICET) 5-325 MG TABLET    Take 1 tablet by mouth every 4 (four) hours as needed for severe pain.    Follow Up: [facility] [address] [place] [address] [phone] Schedule an appointment as soon as possible for a visit  If  symptoms worsen, please return to the nearest ED.     Canary Brim Jodiann Ognibene, MD 04/05/16 [phone]

## 2016-04-04 NOTE — ED Triage Notes (Signed)
Pt reports feeling bad since Monday. Having right side lower back pain and nausea. Denies urinary symptoms or fever. States "she just feels bad."

## 2016-04-04 NOTE — ED Notes (Signed)
Pt in CT.

## 2016-04-05 ENCOUNTER — Emergency Department (HOSPITAL_COMMUNITY): Payer: Self-pay

## 2016-04-05 MED ORDER — CEPHALEXIN 500 MG PO CAPS: 500.0000 mg | ORAL_CAPSULE | Freq: Two times a day (BID) | ORAL | 0 refills | Status: AC

## 2016-04-05 MED ORDER — OXYCODONE-ACETAMINOPHEN 5-325 MG PO TABS: 1.0000 | ORAL_TABLET | ORAL | 0 refills | Status: DC | PRN

## 2016-04-05 MED ORDER — DEXTROSE 5 % IV SOLN
1.0000 g | Freq: Once | INTRAVENOUS | Status: AC
  Administered 2016-04-05: 1 g via INTRAVENOUS
  Filled 2016-04-05: qty 10

## 2016-04-05 MED ORDER — ONDANSETRON HCL 4 MG PO TABS: 4.0000 mg | ORAL_TABLET | Freq: Four times a day (QID) | ORAL | 0 refills | Status: DC

## 2016-04-08 ENCOUNTER — Emergency Department (HOSPITAL_COMMUNITY)
Admission: EM | Admit: 2016-04-08 | Discharge: 2016-04-08 | Disposition: A | Discharge status: Home / Self Care | Payer: Self-pay | Attending: Emergency Medicine | Admitting: Emergency Medicine

## 2016-04-08 ENCOUNTER — Encounter (HOSPITAL_COMMUNITY): Payer: Self-pay | Admitting: Emergency Medicine

## 2016-04-08 DIAGNOSIS — I10 Essential (primary) hypertension: Secondary | ICD-10-CM | POA: Insufficient documentation

## 2016-04-08 DIAGNOSIS — Y999 Unspecified external cause status: Secondary | ICD-10-CM | POA: Insufficient documentation

## 2016-04-08 DIAGNOSIS — S76011A Strain of muscle, fascia and tendon of right hip, initial encounter: Secondary | ICD-10-CM | POA: Insufficient documentation

## 2016-04-08 DIAGNOSIS — Z79899 Other long term (current) drug therapy: Secondary | ICD-10-CM | POA: Insufficient documentation

## 2016-04-08 DIAGNOSIS — X58XXXA Exposure to other specified factors, initial encounter: Secondary | ICD-10-CM | POA: Insufficient documentation

## 2016-04-08 DIAGNOSIS — F17200 Nicotine dependence, unspecified, uncomplicated: Secondary | ICD-10-CM | POA: Insufficient documentation

## 2016-04-08 DIAGNOSIS — Y929 Unspecified place or not applicable: Secondary | ICD-10-CM | POA: Insufficient documentation

## 2016-04-08 DIAGNOSIS — Y939 Activity, unspecified: Secondary | ICD-10-CM | POA: Insufficient documentation

## 2016-04-08 LAB — URINE MICROSCOPIC-ADD ON: RBC / HPF: NONE SEEN RBC/hpf (ref 0–5)

## 2016-04-08 LAB — COMPREHENSIVE METABOLIC PANEL
ALBUMIN: 3.8 g/dL (ref 3.5–5.0)
ALK PHOS: 63 U/L (ref 38–126)
ALT: 21 U/L (ref 14–54)
ANION GAP: 14 (ref 5–15)
AST: 30 U/L (ref 15–41)
BUN: 12 mg/dL (ref 6–20)
CO2: 25 mmol/L (ref 22–32)
Calcium: 10 mg/dL (ref 8.9–10.3)
Chloride: 97 mmol/L — ABNORMAL LOW (ref 101–111)
Creatinine, Ser: 0.9 mg/dL (ref 0.44–1.00)
GFR calc Af Amer: >60 mL/min (ref >60)
GFR calc non Af Amer: >60 mL/min (ref >60)
GLUCOSE: 99 mg/dL (ref 65–99)
Potassium: 3.4 mmol/L — ABNORMAL LOW (ref 3.5–5.1)
SODIUM: 136 mmol/L (ref 135–145)
Total Bilirubin: 0.8 mg/dL (ref 0.3–1.2)
Total Protein: 8.3 g/dL — ABNORMAL HIGH (ref 6.5–8.1)

## 2016-04-08 LAB — URINALYSIS, ROUTINE W REFLEX MICROSCOPIC
GLUCOSE, UA: NEGATIVE mg/dL
Hgb urine dipstick: NEGATIVE
KETONES UR: 15 mg/dL — AB
Leukocytes, UA: NEGATIVE
Nitrite: NEGATIVE
Protein, ur: 100 mg/dL — AB
Specific Gravity, Urine: 1.036 — ABNORMAL HIGH (ref 1.005–1.030)
pH: 5.5 (ref 5.0–8.0)

## 2016-04-08 LAB — CBC WITH DIFFERENTIAL/PLATELET
Basophils Absolute: 0 10*3/uL (ref 0.0–0.1)
Basophils Relative: 0 %
Eosinophils Absolute: 0 10*3/uL (ref 0.0–0.7)
Eosinophils Relative: 0 %
HCT: 38.7 % (ref 36.0–46.0)
Hemoglobin: 13 g/dL (ref 12.0–15.0)
Lymphocytes Relative: 15 %
Lymphs Abs: 1.5 10*3/uL (ref 0.7–4.0)
MCH: 31.6 pg (ref 26.0–34.0)
MCHC: 33.6 g/dL (ref 30.0–36.0)
MCV: 94.2 fL (ref 78.0–100.0)
MONOS PCT: 10 %
Monocytes Absolute: 1 10*3/uL (ref 0.1–1.0)
NEUTROS ABS: 7.7 10*3/uL (ref 1.7–7.7)
Neutrophils Relative %: 75 %
Platelets: 235 10*3/uL (ref 150–400)
RBC: 4.11 MIL/uL (ref 3.87–5.11)
RDW: 12.4 % (ref 11.5–15.5)
WBC: 10.2 10*3/uL (ref 4.0–10.5)

## 2016-04-08 LAB — URINE CULTURE: Culture: >=100000 — AB

## 2016-04-08 MED ORDER — NAPROXEN 500 MG PO TABS
500.0000 mg | ORAL_TABLET | Freq: Two times a day (BID) | ORAL | 0 refills | Status: DC
Start: 2016-04-08 — End: 2016-04-30

## 2016-04-08 MED ORDER — NAPROXEN 250 MG PO TABS
500.0000 mg | ORAL_TABLET | Freq: Once | ORAL | Status: AC
  Administered 2016-04-08: 500 mg via ORAL
  Filled 2016-04-08: qty 2

## 2016-04-08 MED ORDER — BUPIVACAINE HCL (PF) 0.5 % IJ SOLN: 10.0000 mL | Freq: Once | INTRAMUSCULAR | Status: DC

## 2016-04-08 NOTE — ED Provider Notes (Signed)
[facility] Provider Note   CSN: [id number] Arrival date & time: 04/08/16  1145   History   Chief Complaint Chief Complaint  Patient presents with  . Flank Pain   HPI [name] is a 60 y.o. female.  PMH HTN, recently seen in our ED for UTI and flank pain and provided keflex/percocet. Returns today as she continues to have right hip pain and is being told to return to work soon. She states that she has taken her keflex and that the percocet is not helping her hip pain, she has tried no other meds. She reports aching pain that is worse with movement deep in her right hip with some associated right lateral hip numbness. She states that "it feels like I pulled a muscle." She denies dysuria, fevers/chills, nausea/vomiting/abdominal pain. She endorses some ongoing urinary frequency. The pain makes ambulation and activities around the house difficult and uncomfortable.    Past Medical History:  Diagnosis Date  . Hypertension     There are no active problems to display for this patient.   Past Surgical History:  Procedure Laterality Date  . ABDOMINAL HYSTERECTOMY      OB History    No data available       Home Medications    Prior to Admission medications   Medication Sig Start Date End Date Taking? Authorizing Provider  albuterol (PROVENTIL HFA;VENTOLIN HFA) 108 (90 BASE) MCG/ACT inhaler Inhale 1-2 puffs into the lungs every 6 (six) hours as needed for wheezing or shortness of breath. 06/29/14   Mirian Mo, MD  cephALEXin (KEFLEX) 500 MG capsule Take 1 capsule (500 mg total) by mouth 2 (two) times daily. 04/05/16 04/12/16  Canary Brim Tegeler, MD  naphazoline-glycerin (CLEAR EYES) 0.012-0.2 % SOLN Place 1 drop into both eyes 4 (four) times daily as needed for irritation. For dry eyes     Historical Provider, MD  ondansetron (ZOFRAN ODT) 4 MG disintegrating tablet 4mg  ODT q4 hours prn nausea/vomit 06/29/14   Mirian Mo, MD  ondansetron (ZOFRAN) 4 MG  tablet Take 1 tablet (4 mg total) by mouth every 6 (six) hours. 04/05/16   Canary Brim Tegeler, MD  oxyCODONE-acetaminophen (PERCOCET/ROXICET) 5-325 MG tablet Take 1 tablet by mouth every 4 (four) hours as needed for severe pain. 04/05/16   Heide Scales, MD    Family History History reviewed. No pertinent family history.  Social History Social History  Substance Use Topics  . Smoking status: Current Every Day Smoker    Packs/day: 1.00  . Smokeless tobacco: Not on file  . Alcohol use Yes     Comment: occ     Allergies   Review of patient's allergies indicates no known allergies.   Review of Systems Review of Systems  Constitutional: Negative for activity change, appetite change, chills, fatigue and fever.  Respiratory: Negative for shortness of breath.   Cardiovascular: Negative for chest pain.  Gastrointestinal: Negative for abdominal pain, constipation, diarrhea, nausea and vomiting.  Genitourinary: Positive for frequency. Negative for difficulty urinating, dysuria, flank pain, hematuria and urgency.  Musculoskeletal: Positive for gait problem. Negative for back pain, joint swelling, myalgias and neck pain.       Right hip and buttock pain with hip flexion  Skin: Negative for rash.  All other systems reviewed and are negative.    Physical Exam Updated Vital Signs BP (!) 117/104 (BP Location: Right Arm)   Pulse 102   Temp 98.1 F (36.7 C) (Oral)   Resp 18   SpO2  98%   Physical Exam  Constitutional: She is oriented to person, place, and time. She appears well-developed and well-nourished. No distress.  HENT:  Head: Normocephalic and atraumatic.  Eyes: EOM are normal. Pupils are equal, round, and reactive to light.  Neck: Normal range of motion. Neck supple.  Cardiovascular: Normal rate, regular rhythm and normal heart sounds.   Pulmonary/Chest: Effort normal and breath sounds normal.  Abdominal: Soft. Bowel sounds are normal. She exhibits distension. There  is no tenderness. There is no rebound and no guarding.  Genitourinary:  Genitourinary Comments: NO CVA tenderness to percussion  Musculoskeletal: Normal range of motion. She exhibits tenderness (over right lower back, buttock, hip). She exhibits no edema or deformity.  Neurological: She is alert and oriented to person, place, and time.  Skin: Skin is warm and dry. Capillary refill takes less than 2 seconds. No rash noted.  Psychiatric: She has a normal mood and affect.     ED Treatments / Results  Labs (all labs ordered are listed, but only abnormal results are displayed) Labs Reviewed  URINALYSIS, ROUTINE W REFLEX MICROSCOPIC (NOT AT [facility]) - Abnormal; Notable for the following:       Result Value   Color, Urine AMBER (*)    APPearance TURBID (*)    Specific Gravity, Urine 1.036 (*)    Bilirubin Urine MODERATE (*)    Ketones, ur 15 (*)    Protein, ur 100 (*)    All other components within normal limits  URINE MICROSCOPIC-ADD ON - Abnormal; Notable for the following:    Squamous Epithelial / LPF TOO NUMEROUS TO COUNT (*)    Bacteria, UA MANY (*)    All other components within normal limits  URINE CULTURE  CBC WITH DIFFERENTIAL/PLATELET  COMPREHENSIVE METABOLIC PANEL    EKG  EKG Interpretation None       Radiology No results found.  Procedures Procedures (including critical care time)  Medications Ordered in ED Medications - No data to display   Initial Impression / Assessment and Plan / ED Course  I have reviewed the triage vital signs and the nursing notes.  Pertinent labs & imaging results that were available during my care of the patient were reviewed by me and considered in my medical decision making (see chart for details).  Clinical Course    Intermittent right hip pain few several weeks, again these past few days, recent UTI. Pain with active movement/flexion of the right hip. Suspect muscle strain vs hip osteoarthritis. Rx Naproxen 500mg  BID for 5  days  Final Clinical Impressions(s) / ED Diagnoses   Final diagnoses:  None    New Prescriptions New Prescriptions   No medications on file     Althia FortsAdam Camren Henthorn, MD 04/08/16 1447    Lyndal Pulleyaniel Knott, MD 04/08/16 1931

## 2016-04-08 NOTE — ED Notes (Signed)
Pt is in stable condition upon d/c and ambulates from ED. 

## 2016-04-08 NOTE — Discharge Instructions (Signed)
Please take Naproxen 500mg  twice a day for 5 days to reduce inflammation. Try and stay active. You appear to have a strain of one of the muscles in your hip.

## 2016-04-08 NOTE — ED Triage Notes (Addendum)
Pt reports that she is still having left sided pain post treatment for kidney infection. Been on cephlex and taking perco.Reprots she has been drinking water a lot.  Really she states she is here for pain meds and to not go back to work so soon.

## 2016-04-09 ENCOUNTER — Telehealth (HOSPITAL_COMMUNITY): Payer: Self-pay

## 2016-04-09 LAB — URINE CULTURE

## 2016-04-09 NOTE — Telephone Encounter (Signed)
Post ED Visit - Positive Culture Follow-up  Culture report reviewed by antimicrobial stewardship pharmacist:  []  [name], Pharm.D. []  [name], Pharm.D., BCPS []  [name], Pharm.D. []  [name], [address] BCPS []  [name], [address] BCPS, AAHIVP []  [name], Pharm.D., BCPS, AAHIVP []  [name], Pharm.D. []  [name], [address] X  [name], Pharm.D.  Positive urine culture, >/= 100,000 colonies -> E Coli Treated with Cephalexin, organism sensitive to the same and no further patient follow-up is required at this time.   [name], [name] 04/09/2016, 9:11 AM

## 2016-04-30 ENCOUNTER — Ambulatory Visit (HOSPITAL_COMMUNITY)
Admission: EM | Admit: 2016-04-30 | Discharge: 2016-04-30 | Disposition: A | Discharge status: Home / Self Care | Payer: Self-pay | Attending: Emergency Medicine | Admitting: Emergency Medicine

## 2016-04-30 ENCOUNTER — Encounter (HOSPITAL_COMMUNITY): Payer: Self-pay | Admitting: Emergency Medicine

## 2016-04-30 ENCOUNTER — Ambulatory Visit (INDEPENDENT_AMBULATORY_CARE_PROVIDER_SITE_OTHER): Payer: Self-pay

## 2016-04-30 DIAGNOSIS — M25551 Pain in right hip: Secondary | ICD-10-CM

## 2016-04-30 DIAGNOSIS — R1031 Right lower quadrant pain: Secondary | ICD-10-CM

## 2016-04-30 MED ORDER — TIZANIDINE HCL 4 MG PO TABS: 4.0000 mg | ORAL_TABLET | Freq: Three times a day (TID) | ORAL | 0 refills | Status: DC | PRN

## 2016-04-30 MED ORDER — TRAMADOL HCL 50 MG PO TABS: ORAL_TABLET | ORAL | 0 refills | Status: DC

## 2016-04-30 MED ORDER — NAPROXEN 500 MG PO TABS: 500.0000 mg | ORAL_TABLET | Freq: Two times a day (BID) | ORAL | 0 refills | Status: DC

## 2016-04-30 NOTE — Discharge Instructions (Signed)
Below is a list of primary care practices who are taking new patients for you to follow-up with. ° ° °[facility] °[phone] °[address] °[place] [address] ° °[facility]: [address] °[place] [address]  °[phone] ° °[facility]: [address] °[place] [address]   °[phone] ° °[facility]: [address] °[place] [address]  °[phone] ° °[facility] : [address] [place] [address] °[phone] ° °[facility]: [address] °[place] [address] °[phone] ° °[facility]: [address] °[place] [address] °[phone] ° °Dr. Mahima Pandey [address] [facility] [place] [address]  °[phone] ° °Dr. George Osei-Bonsu, [facility]. [address] [place], [place] [address]  °[phone] ° °  °

## 2016-04-30 NOTE — ED Provider Notes (Signed)
HPI  SUBJECTIVE:  [name] is a 60 y.o. female who presents with 1 month of RLQ pain/right groin pain with radiation to her hip and back. She states it is intermittent, lasts hours. Describes pain as sharp. Symptoms are worse with movement of her right hip, no alleviating factors. She has not tried anything for this. She denies nausea, vomiting, fevers, dysuria, urinary urgency, frequency, cloudy or odorous urine, hematuria. No vaginal bleeding. No labial swelling, groin bulging. No vaginal discharge, odor. No abdominal distention. She is tolerating by mouth. No melena, hematochezia, diarrhea. No trauma to her abdomen. She is in a monogamous sexual relationship with a female who is asymptomatic. STDs are not a concern today. She has a past medical history of hypertension, kidney stones, she is status post abdominal hysterectomy. No other abdominal surgeries. No history of diabetes, PID, STDs, BV, yeast. No history of diverticulitis, diverticulosis, small bowel obstruction. PMD: None.  Patient was seen in the ED on 9/29 for right flank pain. She was found to have a UTI. non contrast CT abdomen was negative for stone. She was seen again in the ER and 10/3 for the same complaints as today, it was thought to be from musculoskeletal etiology. She was prescribed NSAIDs. She provided a contaminated urine specimen, culture did not grow out anything specific for UTI.    Past Medical History:  Diagnosis Date  . Hypertension     Past Surgical History:  Procedure Laterality Date  . ABDOMINAL HYSTERECTOMY      History reviewed. No pertinent family history.  Social History  Substance Use Topics  . Smoking status: Current Every Day Smoker    Packs/day: 1.00  . Smokeless tobacco: Never Used  . Alcohol use Yes     Comment: occ    No current facility-administered medications for this encounter.   Current Outpatient Prescriptions:  .  albuterol (PROVENTIL HFA;VENTOLIN HFA) 108 (90 BASE)  MCG/ACT inhaler, Inhale 1-2 puffs into the lungs every 6 (six) hours as needed for wheezing or shortness of breath. (Patient not taking: Reported on 04/08/2016), Disp: 1 Inhaler, Rfl: 0 .  naproxen (NAPROSYN) 500 MG tablet, Take 1 tablet (500 mg total) by mouth 2 (two) times daily with a meal., Disp: 20 tablet, Rfl: 0 .  ondansetron (ZOFRAN ODT) 4 MG disintegrating tablet, 4mg  ODT q4 hours prn nausea/vomit (Patient not taking: Reported on 04/08/2016), Disp: 4 tablet, Rfl: 0 .  ondansetron (ZOFRAN) 4 MG tablet, Take 1 tablet (4 mg total) by mouth every 6 (six) hours. (Patient not taking: Reported on 04/08/2016), Disp: 12 tablet, Rfl: 0 .  tiZANidine (ZANAFLEX) 4 MG tablet, Take 1 tablet (4 mg total) by mouth every 8 (eight) hours as needed for muscle spasms., Disp: 30 tablet, Rfl: 0 .  traMADol (ULTRAM) 50 MG tablet, 1-2 tabs po q 6 hr prn pain Maximum dose= 8 tablets per day, Disp: 20 tablet, Rfl: 0  No Known Allergies   ROS  As noted in HPI.   Physical Exam  BP 133/87 (BP Location: Left Arm)   Pulse 113   Temp 99.8 F (37.7 C) (Oral)   Resp 18   SpO2 99%   Constitutional: Well developed, well nourished, no acute distress Eyes:  EOMI, conjunctiva normal bilaterally HENT: Normocephalic, atraumatic,mucus membranes moist Respiratory: Normal inspiratory effort Cardiovascular: Normal rate GI: nondistended soft, nontender. Normal bowel sounds. No right lower quadrant tenderness. Positive tenderness along the inguinal area. No other abdominal tenderness. Back: No CVA tenderness. skin: No rash, skin intact  GU: No labial swelling, tenderness. No appreciable bulging. No palpable mass in the inguinal area. Musculoskeletal: Positive tenderness over right hip, right low back. No bony tenderness L-spine. Pain with internal/external rotation of her hip right side. She has normal gait. Physical exam of her lower extremities otherwise normal. no deformities Neurologic: Alert & oriented x 3, no focal  neuro deficits Psychiatric: Speech and behavior appropriate   ED Course   Medications - No data to display  Orders Placed This Encounter  Procedures  . DG Pelvis 1-2 Views    Standing Status:   Standing    Number of Occurrences:   1    Order Specific Question:   Reason for Exam (SYMPTOM  OR DIAGNOSIS REQUIRED)    Answer:   R hip pain 1 mo r/o severe OA    No results found for this or any previous visit (from the past 24 hour(s)). Dg Pelvis 1-2 Views  Result Date: 04/30/2016 CLINICAL DATA:  Right groin and buttock pain for 1 month, no known injury, initial encounter EXAM: PELVIS - 1-2 VIEW COMPARISON:  04/05/2016 FINDINGS: Pelvic ring is intact. Mild degenerative changes of the hip joints, lumbar spine and pubic symphysis are seen. The sacral ala are within normal limits. No acute abnormality is noted. IMPRESSION: No acute abnormality seen. Electronically Signed   By: Alcide CleverMark  Lukens M.D.   On: 04/30/2016 17:31    ED Clinical Impression  Right inguinal pain  Pain of right hip joint  ED Assessment/Plan   Previous records reviewed. As noted in history of present illness.  Reviewed imaging independently. Mild degenerative changes of  Hip joints, lumbar spine and pubic symphysis. No acute abnormalities noted.. See radiology report for details  She has tenderness along the groin, but no labial swelling. No appreciable bulging or palpable inguinal mass. She does have tenderness along her right hip and in her low back. Could be from altered gait. She has no history of trauma, doubt fracture.  Pelvic films were done to rule out severe osteoarthritis. These were negative.  She had no urinary complaints, so did not repeat a UA.   suspect musculoskeletal origin versus a small hernia, however, doubt incarceration.   Plan to send home with Refill of Naprosyn, Zanaflex and tramadol and a primary care referral  Discussed  imaging, MDM, plan and followup with patient. Discussed sn/sx that  should prompt return to the ED. Patient agrees with plan.   Meds ordered this encounter  Medications  . naproxen (NAPROSYN) 500 MG tablet    Sig: Take 1 tablet (500 mg total) by mouth 2 (two) times daily with a meal.    Dispense:  20 tablet    Refill:  0  . tiZANidine (ZANAFLEX) 4 MG tablet    Sig: Take 1 tablet (4 mg total) by mouth every 8 (eight) hours as needed for muscle spasms.    Dispense:  30 tablet    Refill:  0  . traMADol (ULTRAM) 50 MG tablet    Sig: 1-2 tabs po q 6 hr prn pain Maximum dose= 8 tablets per day    Dispense:  20 tablet    Refill:  0    *This clinic note was created using Scientist, clinical (histocompatibility and immunogenetics)Dragon dictation software. Therefore, there may be occasional mistakes despite careful proofreading.  ?   Domenick GongAshley Barbette Mcglaun, MD 04/30/16 2122

## 2016-04-30 NOTE — ED Triage Notes (Signed)
[facility] with a [facility] of RLQ abdominal pain that radiates into her right lower back. The patient stated that the pain has been ongoing for over 1 month and the patient was worked up for the same complaint on 04/04/2016.

## 2016-05-08 ENCOUNTER — Ambulatory Visit (HOSPITAL_COMMUNITY)
Admission: EM | Admit: 2016-05-08 | Discharge: 2016-05-08 | Disposition: A | Discharge status: Home / Self Care | Payer: Self-pay | Attending: Emergency Medicine | Admitting: Emergency Medicine

## 2016-05-08 ENCOUNTER — Encounter (HOSPITAL_COMMUNITY): Payer: Self-pay | Admitting: Emergency Medicine

## 2016-05-08 DIAGNOSIS — M25551 Pain in right hip: Secondary | ICD-10-CM

## 2016-05-08 MED ORDER — CYCLOBENZAPRINE HCL 10 MG PO TABS: 10.0000 mg | ORAL_TABLET | Freq: Two times a day (BID) | ORAL | 0 refills | Status: DC | PRN

## 2016-05-08 MED ORDER — NAPROXEN 500 MG PO TABS: 500.0000 mg | ORAL_TABLET | Freq: Two times a day (BID) | ORAL | 0 refills | Status: DC

## 2016-05-08 NOTE — ED Triage Notes (Signed)
Here for persistent pain on right side inguinal area onset this am.   Reports she was seen here on 10/25 for similar sx  Pain increases w/movement or pressure  A&O x4... NAD

## 2016-05-08 NOTE — Discharge Instructions (Signed)
Continue Naproxen twice a day with food for swelling and inflammation. May take muscle relaxer Flexeril twice a day as needed for muscle pain. Call Orthopedic today to schedule appointment for further evaluation.

## 2016-05-08 NOTE — ED Provider Notes (Signed)
CSN: [id number]     Arrival date & time 05/08/16  1035 History   First MD Initiated Contact with Patient 05/08/16 1151     Chief Complaint  Patient presents with  . Abdominal Pain   (Consider location/radiation/quality/duration/timing/severity/associated sxs/prior Treatment) 60 year old female presents with right sided hip pain that increased this morning. She was first seen here on 04/30/16 for similar symptoms and previously seen in the ER at the end of September 2017 and thought to have a  possible musculoskeletal etiology. Pain has now settled more in right hip joint and along inguinal area. Noticed some swelling in area this morning but has resolved now. Pain increases with movement. She has been taking Naproxen and Zanaflex with some relief. Also taking Tramadol at night for pain but is confused over which medication she is taking when and did not bring her medication bottles with her. X-rays were taken of her pelvis at her last visit in Urgent Care and were essentially negative. She has yet to make an appointment with a PCP for further evaluation.      Past Medical History:  Diagnosis Date  . Hypertension    Past Surgical History:  Procedure Laterality Date  . ABDOMINAL HYSTERECTOMY     History reviewed. No pertinent family history. Social History  Substance Use Topics  . Smoking status: Current Every Day Smoker    Packs/day: 1.00  . Smokeless tobacco: Never Used  . Alcohol use Yes     Comment: occ   OB History    No data available     Review of Systems  Constitutional: Negative for appetite change, chills, fatigue, fever and unexpected weight change.  Cardiovascular: Negative for chest pain.  Gastrointestinal: Negative for abdominal pain, blood in stool, constipation, diarrhea, nausea and vomiting.  Genitourinary: Positive for hematuria. Negative for decreased urine volume, difficulty urinating, dysuria and flank pain.  Musculoskeletal: Positive for arthralgias and  back pain.  Skin: Negative for rash and wound.  Neurological: Negative for dizziness, weakness and headaches.  Hematological: Negative for adenopathy.    Allergies  Review of patient's allergies indicates no known allergies.  Home Medications   Prior to Admission medications   Medication Sig Start Date End Date Taking? Authorizing Provider  cyclobenzaprine (FLEXERIL) 10 MG tablet Take 1 tablet (10 mg total) by mouth 2 (two) times daily as needed for muscle spasms. 05/08/16   Sudie GrumblingAnn Berry Amyot, NP  naproxen (NAPROSYN) 500 MG tablet Take 1 tablet (500 mg total) by mouth 2 (two) times daily. 05/08/16   Sudie GrumblingAnn Berry Amyot, NP   Meds Ordered and Administered this Visit  Medications - No data to display  BP 138/87 (BP Location: Right Arm)   Pulse 88   Temp 97.9 F (36.6 C) (Oral)   Resp 16   SpO2 98%  No data found.   Physical Exam  Constitutional: She is oriented to person, place, and time. She appears well-developed and well-nourished. No distress.  Cardiovascular: Normal rate, regular rhythm and normal heart sounds.   Pulmonary/Chest: Effort normal and breath sounds normal. No respiratory distress. She has no wheezes.  Abdominal: Soft. Bowel sounds are normal. She exhibits no distension and no mass. There is no hepatosplenomegaly. There is tenderness in the right lower quadrant. There is no rigidity, no rebound, no guarding and no CVA tenderness. No hernia. Hernia confirmed negative in the right inguinal area and confirmed negative in the left inguinal area.  Pain more along right hip joint than lower right quadrant  of abdomen. No swelling or edema seen. No hernia present.  Musculoskeletal:       Right hip: She exhibits tenderness. She exhibits normal range of motion, normal strength, no swelling, no crepitus and no deformity.       Legs: Has full range of motion of right hip but pain with flexion and rotation of hip. Tender along iliac crest. No swelling seen. No numbness or neuro  deficits noted. Good pulses and capillary refill.   Lymphadenopathy:       Right: No inguinal adenopathy present.       Left: No inguinal adenopathy present.  Neurological: She is alert and oriented to person, place, and time. She has normal strength. No sensory deficit. Coordination and gait normal.  Skin: Skin is warm and dry. Capillary refill takes less than 2 seconds.  Psychiatric: Her speech is normal and behavior is normal. Judgment and thought content normal. Her mood appears anxious. Cognition and memory are normal.    Urgent Care Course   Clinical Course    Procedures (including critical care time)  Labs Review Labs Reviewed - No data to display  Imaging Review No results found.   Visual Acuity Review  Right Eye Distance:   Left Eye Distance:   Bilateral Distance:    Right Eye Near:   Left Eye Near:    Bilateral Near:         MDM   1. Pain of right hip joint    Discussed with patient that symptoms and clinical findings still  support a musculoskeletal etiology- possible osteoarthritis. Recommend continue Naproxen 500mg  twice a day with food for swelling and inflammation #30. Will trial Flexeril 10mg  #20 once to twice a day as needed for muscle pain and spasm. May use Tramadol at night if needed for severe pain (patient still has pills remaining from previous visit). Information provided regarding local Orthopedic. Recommend call Orthopedic today to schedule appointment for further evaluation.      Sudie GrumblingAnn Berry Amyot, NP 05/08/16 2025

## 2016-06-13 LAB — GLUCOSE, POCT (MANUAL RESULT ENTRY): POC GLUCOSE: 114 mg/dL — AB (ref 70–99)

## 2016-10-01 ENCOUNTER — Encounter (HOSPITAL_COMMUNITY): Payer: Self-pay | Admitting: Family Medicine

## 2016-10-01 ENCOUNTER — Ambulatory Visit (HOSPITAL_COMMUNITY)
Admission: EM | Admit: 2016-10-01 | Discharge: 2016-10-01 | Disposition: A | Discharge status: Home / Self Care | Payer: BLUE CROSS/BLUE SHIELD | Attending: Family Medicine | Admitting: Family Medicine

## 2016-10-01 DIAGNOSIS — L301 Dyshidrosis [pompholyx]: Secondary | ICD-10-CM

## 2016-10-01 MED ORDER — FLUOCINONIDE-E 0.05 % EX CREA: 1.0000 "application " | TOPICAL_CREAM | Freq: Three times a day (TID) | CUTANEOUS | 2 refills | Status: DC

## 2016-10-01 NOTE — ED Provider Notes (Signed)
[facility]    CSN: [id number] Arrival date & time: 10/01/16  1045     History   Chief Complaint Chief Complaint  Patient presents with  . Rash    HPI [name] is a 61 y.o. female.   The patient, a 61 year old female, presented to the [facility] with a complaint of a rash on both hands x 2 months. 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  Her hands (palm) is have been itching quite a bit since his started. Her left hand is worse in her right.      Past Medical History:  Diagnosis Date  . Hypertension     There are no active problems to display for this patient.   Past Surgical History:  Procedure Laterality Date  . ABDOMINAL HYSTERECTOMY      OB History    No data available       Home Medications    Prior to Admission medications   Medication Sig Start Date End Date Taking? Authorizing Provider  fluocinonide-emollient (LIDEX-E) 0.05 % cream Apply 1 application topically 3 (three) times daily. 10/01/16   Elvina Sidle, MD    Family History History reviewed. No pertinent family history.  Social History Social History  Substance Use Topics  . Smoking status: Current Every Day Smoker    Packs/day: 1.00  . Smokeless tobacco: Never Used  . Alcohol use Yes     Comment: occ     Allergies   Patient has no known allergies.   Review of Systems Review of Systems  Constitutional: Negative.   Skin: Positive for rash.     Physical Exam Triage Vital Signs ED Triage Vitals [10/01/16 1158]  Enc Vitals Group     BP (!) 160/102     Pulse Rate 73     Resp 18     Temp 97.7 F (36.5 C)     Temp Source Oral     SpO2 96 %     Weight      Height      Head Circumference      Peak Flow      Pain Score 0     Pain Loc      Pain Edu?      Excl. in GC?    No data found.   Updated Vital Signs BP (!) 160/102 (BP Location: Right Arm)   Pulse 73   Temp 97.7 F (36.5 C) (Oral)   Resp 18    SpO2 96%      Physical Exam  Constitutional: She is oriented to person, place, and time. She appears well-developed and well-nourished.  HENT:  Right Ear: External ear normal.  Left Ear: External ear normal.  Mouth/Throat: Oropharynx is clear and moist.  Eyes: Conjunctivae and EOM are normal. Pupils are equal, round, and reactive to light.  Neck: Normal range of motion. Neck supple.  Pulmonary/Chest: Effort normal.  Musculoskeletal: Normal range of motion.  Neurological: She is alert and oriented to person, place, and time.  Skin: Skin is warm and dry.  Dyshidrotic eczema on left palm and, to a lesser extent, right palm  Nursing note and vitals reviewed.    UC Treatments / Results  Labs (all labs ordered are listed, but only abnormal results are displayed) Labs Reviewed - No data to display  EKG  EKG Interpretation None       Radiology No results found.  Procedures Procedures (including critical  care time)  Medications Ordered in UC Medications - No data to display   Initial Impression / Assessment and Plan / UC Course  I have reviewed the triage vital signs and the nursing notes.  Pertinent labs & imaging results that were available during my care of the patient were reviewed by me and considered in my medical decision making (see chart for details).     Final Clinical Impressions(s) / UC Diagnoses   Final diagnoses:  Dyshidrotic eczema    New Prescriptions New Prescriptions   FLUOCINONIDE-EMOLLIENT (LIDEX-E) 0.05 % CREAM    Apply 1 application topically 3 (three) times daily.     Elvina SidleKurt Sway Guttierrez, MD 10/01/16 [phone]

## 2016-10-01 NOTE — Discharge Instructions (Signed)
Avoid latex gloves.  Use Nitrile gloves when washing dishes

## 2016-10-01 NOTE — ED Triage Notes (Signed)
[facility] a [facility] of a rash on both hands x 2 months.

## 2016-10-08 ENCOUNTER — Ambulatory Visit (HOSPITAL_COMMUNITY)
Admission: EM | Admit: 2016-10-08 | Discharge: 2016-10-08 | Disposition: A | Discharge status: Home / Self Care | Payer: BLUE CROSS/BLUE SHIELD | Attending: Family Medicine | Admitting: Family Medicine

## 2016-10-08 MED ORDER — TRIAMCINOLONE ACETONIDE 0.1 % EX CREA: 1.0000 "application " | TOPICAL_CREAM | Freq: Two times a day (BID) | CUTANEOUS | 0 refills | Status: DC

## 2016-10-08 NOTE — ED Notes (Signed)
Spoke with bill oxford, np ..patient reports prescribed medicine is too expensive.  [place], np wrote for a different cream and escribed to pharmacy.  Notified patient, chart removed from chart rack

## 2017-04-15 ENCOUNTER — Encounter (HOSPITAL_COMMUNITY): Payer: Self-pay | Admitting: Family Medicine

## 2017-04-15 ENCOUNTER — Telehealth (HOSPITAL_COMMUNITY): Payer: Self-pay | Admitting: Family Medicine

## 2017-04-15 ENCOUNTER — Ambulatory Visit (HOSPITAL_COMMUNITY)
Admission: EM | Admit: 2017-04-15 | Discharge: 2017-04-15 | Disposition: A | Discharge status: Home / Self Care | Payer: BLUE CROSS/BLUE SHIELD | Attending: Family Medicine | Admitting: Family Medicine

## 2017-04-15 DIAGNOSIS — M7661 Achilles tendinitis, right leg: Secondary | ICD-10-CM

## 2017-04-15 MED ORDER — NAPROXEN 375 MG PO TABS: 375.0000 mg | ORAL_TABLET | Freq: Two times a day (BID) | ORAL | 0 refills | Status: DC

## 2017-04-15 NOTE — ED Provider Notes (Signed)
  [facility]   [id number] 04/15/17 Arrival Time: 1120  ASSESSMENT & PLAN:  1. Tendonitis, Achilles, right    Meds ordered this encounter  Medications  . naproxen (NAPROSYN) 375 MG tablet    Sig: Take 1 tablet (375 mg total) by mouth 2 (two) times daily.    Dispense:  20 tablet    Refill:  0   Placed in walking boot. Will f/u in 2 weeks for recheck, sooner if needed. Work note given. Reviewed expectations re: course of current medical issues. Questions answered. Outlined signs and symptoms indicating need for more acute intervention. Patient verbalized understanding. After Visit Summary given.   SUBJECTIVE:  [name] is a 61 y.o. female who reports pain of her R ankle for several weeks, slightly worse over the past few days. No injury. Describes sharp pain at back of foot with certain movements. No swelling noted. No extremity sensation changes. No OTC medicines tried. No h/o similar. Pain has not limited her ADLs.  ROS: As per HPI.   OBJECTIVE:  Vitals:   04/15/17 1147  BP: (!) 150/90  Pulse: 71  Resp: 18  Temp: 98.3 F (36.8 C)  SpO2: 100%    General appearance: alert; no distress Extremities: no cyanosis or edema; symmetrical with no gross deformities; much tenderness over her right Achilles tendon with no swelling and no bruising; FROM of ankle CV: normal extremity capillary refill Skin: warm and dry; no erythema Neurologic: normal gait; normal symmetric reflexes in all extremities; normal sensation Psychological: alert and cooperative; normal mood and affect  No Known Allergies  Past Medical History:  Diagnosis Date  . Hypertension    Social History   Social History  . Marital status: Widowed    Spouse name: N/A  . Number of children: N/A  . Years of education: N/A   Occupational History  . Not on file.   Social History Main Topics  . Smoking status: Current Every Day Smoker    Packs/day: 1.00  . Smokeless tobacco: Never  Used  . Alcohol use Yes     Comment: occ  . Drug use: No  . Sexual activity: Not on file   Other Topics Concern  . Not on file   Social History Narrative  . No narrative on file   History reviewed. No pertinent family history. Past Surgical History:  Procedure Laterality Date  . ABDOMINAL HYSTERECTOMY       Mardella Layman, MD 04/15/17 1205

## 2017-04-15 NOTE — ED Triage Notes (Signed)
Pt here for right ankle pain. Reports that it has been ongoing for a while but yesterday she rolled it and the pain is now worse.

## 2017-07-02 ENCOUNTER — Other Ambulatory Visit: Payer: Self-pay

## 2017-07-02 ENCOUNTER — Encounter (HOSPITAL_COMMUNITY): Payer: Self-pay | Admitting: Emergency Medicine

## 2017-07-02 ENCOUNTER — Emergency Department (HOSPITAL_COMMUNITY)
Admission: EM | Admit: 2017-07-02 | Discharge: 2017-07-02 | Disposition: A | Discharge status: Home / Self Care | Payer: Self-pay | Attending: Emergency Medicine | Admitting: Emergency Medicine

## 2017-07-02 DIAGNOSIS — R21 Rash and other nonspecific skin eruption: Secondary | ICD-10-CM | POA: Insufficient documentation

## 2017-07-02 DIAGNOSIS — I10 Essential (primary) hypertension: Secondary | ICD-10-CM | POA: Insufficient documentation

## 2017-07-02 DIAGNOSIS — F17210 Nicotine dependence, cigarettes, uncomplicated: Secondary | ICD-10-CM | POA: Insufficient documentation

## 2017-07-02 MED ORDER — HYDROXYZINE HCL 25 MG PO TABS: 25.0000 mg | ORAL_TABLET | Freq: Three times a day (TID) | ORAL | 0 refills | Status: DC | PRN

## 2017-07-02 MED ORDER — TRIAMCINOLONE ACETONIDE 0.1 % EX CREA: 1.0000 "application " | TOPICAL_CREAM | Freq: Two times a day (BID) | CUTANEOUS | 0 refills | Status: DC

## 2017-07-02 MED ORDER — PREDNISONE 10 MG (21) PO TBPK: ORAL_TABLET | ORAL | 0 refills | Status: DC

## 2017-07-02 NOTE — Discharge Instructions (Signed)
Follow up with Dr. Margo AyeHall for further evaluation of the rash and itching.

## 2017-07-02 NOTE — ED Notes (Signed)
Prescriptions given, no questions at dc

## 2017-07-02 NOTE — ED Triage Notes (Signed)
Pt has raised itchy rash to hands and states it has spread to other parts of her body. States she was told she was allergic to "gloves at work" per doctor and tried cream but did not work. Rash has been there for 1 month. Denies other symptoms

## 2017-07-02 NOTE — ED Provider Notes (Signed)
[facility]   CSN: [id number] Arrival date & time: 07/02/17  1421     History   Chief Complaint Chief Complaint  Patient presents with  . Rash    HPI [name] is a 61 y.o. female who presents to the ED with a rash. The rash started on the hands and has now spread other places. Patient was evaluated and told she was allergic to gloves at work. She has been using prescribed cream without relief. The rash has been there for one month. Now the rash is spreading to the forearms, chest and thighs. Patient denies respiratory symptoms or difficulty swallowing. Patient denies any new food, detergents, soap, lotion or other products.   HPI  Past Medical History:  Diagnosis Date  . Hypertension     There are no active problems to display for this patient.   Past Surgical History:  Procedure Laterality Date  . ABDOMINAL HYSTERECTOMY      OB History    No data available       Home Medications    Prior to Admission medications   Medication Sig Start Date End Date Taking? Authorizing Provider  hydrOXYzine (ATARAX/VISTARIL) 25 MG tablet Take 1 tablet (25 mg total) by mouth every 8 (eight) hours as needed. 07/02/17   Janne NapoleonNeese, Aleatha Taite M, NP  naproxen (NAPROSYN) 375 MG tablet Take 1 tablet (375 mg total) by mouth 2 (two) times daily. 04/15/17   Mardella LaymanHagler, Brian, MD  predniSONE (STERAPRED UNI-PAK 21 TAB) 10 MG (21) TBPK tablet Take 6 tablets PO day one, then 5, 4, 3, 2, 1 07/02/17   Damian LeavellNeese, LeonardHope M, NP  triamcinolone cream (KENALOG) 0.1 % Apply 1 application topically 2 (two) times daily. 07/02/17   Janne NapoleonNeese, Dalonte Hardage M, NP    Family History No family history on file.  Social History Social History   Tobacco Use  . Smoking status: Current Every Day Smoker    Packs/day: 1.00  . Smokeless tobacco: Never Used  Substance Use Topics  . Alcohol use: Yes    Comment: occ  . Drug use: No     Allergies   Patient has no known  allergies.   Review of Systems Review of Systems  Constitutional: Negative for chills and fever.  HENT: Negative.   Eyes: Negative for discharge and itching.  Respiratory: Negative for shortness of breath.   Cardiovascular: Negative for chest pain.  Gastrointestinal: Negative for vomiting.  Skin: Positive for rash.  Neurological: Negative for headaches.  Hematological: Negative for adenopathy.  Psychiatric/Behavioral: Negative for confusion.     Physical Exam Updated Vital Signs BP (!) 157/108   Pulse 86   Temp 98.9 F (37.2 C) (Oral)   Resp 20   Wt 72.6 kg (160 lb)   SpO2 100%   BMI 26.63 kg/m   Physical Exam  Constitutional: She appears well-developed and well-nourished. No distress.  HENT:  Head: Normocephalic and atraumatic.  Eyes: EOM are normal.  Neck: Neck supple.  Cardiovascular: Normal rate and regular rhythm.  Pulmonary/Chest: Effort normal and breath sounds normal.  Abdominal: Soft. There is no tenderness.  Musculoskeletal: Normal range of motion.  Neurological: She is alert.  Skin: Skin is warm and dry.  Dry patchy area to hands, forearms, chest and a few areas noted to the inner thighs.   Psychiatric: She has a normal mood and affect.  Nursing note and vitals reviewed.    ED Treatments / Results  Labs (all labs ordered are  listed, but only abnormal results are displayed) Labs Reviewed - No data to display   Radiology No results found.  Procedures Procedures (including critical care time)  Medications Ordered in ED Medications - No data to display   Initial Impression / Assessment and Plan / ED Course  I have reviewed the triage vital signs and the nursing notes. 61 y.o. female with with rash and itching stable for d/c without fever, neck pain, shortness of breath or difficulty swallowing. Will teat with medications for possible allergic dermatitis and have patient f/u with dermatologist. Patient agrees with plan.  Final Clinical  Impressions(s) / ED Diagnoses   Final diagnoses:  Rash and nonspecific skin eruption    ED Discharge Orders        Ordered    hydrOXYzine (ATARAX/VISTARIL) 25 MG tablet  Every 8 hours PRN     07/02/17 1641    predniSONE (STERAPRED UNI-PAK 21 TAB) 10 MG (21) TBPK tablet     07/02/17 1641    triamcinolone cream (KENALOG) 0.1 %  2 times daily     07/02/17 1641       Damian Leavelleese, PensacolaHope M, NP 07/02/17 1751    Mancel BaleWentz, Elliott, MD 07/03/17 Ernestina [id number]

## 2017-08-17 ENCOUNTER — Encounter (HOSPITAL_COMMUNITY): Payer: Self-pay

## 2017-08-17 ENCOUNTER — Other Ambulatory Visit: Payer: Self-pay

## 2017-08-17 ENCOUNTER — Emergency Department (HOSPITAL_COMMUNITY)
Admission: EM | Admit: 2017-08-17 | Discharge: 2017-08-17 | Disposition: A | Discharge status: Home / Self Care | Payer: Self-pay | Attending: Emergency Medicine | Admitting: Emergency Medicine

## 2017-08-17 DIAGNOSIS — F17200 Nicotine dependence, unspecified, uncomplicated: Secondary | ICD-10-CM | POA: Insufficient documentation

## 2017-08-17 DIAGNOSIS — I10 Essential (primary) hypertension: Secondary | ICD-10-CM | POA: Insufficient documentation

## 2017-08-17 DIAGNOSIS — Z79899 Other long term (current) drug therapy: Secondary | ICD-10-CM | POA: Insufficient documentation

## 2017-08-17 DIAGNOSIS — R21 Rash and other nonspecific skin eruption: Secondary | ICD-10-CM | POA: Insufficient documentation

## 2017-08-17 MED ORDER — HYDROXYZINE HCL 25 MG PO TABS: 25.0000 mg | ORAL_TABLET | Freq: Three times a day (TID) | ORAL | 0 refills | Status: DC | PRN

## 2017-08-17 MED ORDER — PREDNISONE 10 MG (21) PO TBPK: ORAL_TABLET | ORAL | 0 refills | Status: DC

## 2017-08-17 MED ORDER — TRIAMCINOLONE ACETONIDE 0.1 % EX CREA: 1.0000 "application " | TOPICAL_CREAM | Freq: Two times a day (BID) | CUTANEOUS | 0 refills | Status: DC

## 2017-08-17 NOTE — Discharge Instructions (Signed)
Take medications as prescribed. After you wash your hands, it is important that you apply lotion.  You should do this every single time. Make sure you are wearing gloves if you are going to be soaking or submerging her hands in water for extended periods of time. Follow-up with the dermatologist for further evaluation and management of your hands. Return to the emergency room if you develop fevers or any new or worsening symptoms.

## 2017-08-17 NOTE — ED Triage Notes (Signed)
Patient here with recurrent hand peeling. States that she is out of her eczema cream. Complains of burning to same.

## 2017-08-17 NOTE — ED Provider Notes (Signed)
[facility] EMERGENCY DEPARTMENT Provider Note   CSN: [id number] Arrival date & time: 08/17/17  1412     History   Chief Complaint No chief complaint on file.   HPI [name] is a 62 y.o. female presenting for evaluation of skin rash on the hands.  Patient states she has had a rash on her hands since December.  She was evaluated in January, given oral prednisone, triamcinolone cream, and Vistaril for her symptoms.  She reports mild improvement, but she completed the course of these medications.  A week ago, she had worsening of the rash on her hands again.  The rash is on the palms of her hands.  It is itchy and painful.  No drainage.  No one else at home has similar.  It is worse after she showers, nothing makes it better. She has been using baby lotion on her hands without improvement.  Patient has been working in [employer], occasionally uses gloves.  At the previous visit, there was some concern about a contact dermatitis with the powder in the gloves.  However, she has not been wearing these gloves for the past several weeks.  She denies fevers, chills, streaking redness, purulent drainage, chest pain, shortness of breath, nausea, vomiting, or abdominal pain.  She is not immunocompromised.  No history of diabetes.  No history of eczema in the past.  Patient reports a history of allergies, no history of asthma.  HPI  Past Medical History:  Diagnosis Date  . Hypertension     There are no active problems to display for this patient.   Past Surgical History:  Procedure Laterality Date  . ABDOMINAL HYSTERECTOMY      OB History    No data available       Home Medications    Prior to Admission medications   Medication Sig Start Date End Date Taking? Authorizing Provider  hydrOXYzine (ATARAX/VISTARIL) 25 MG tablet Take 1 tablet (25 mg total) by mouth every 8 (eight) hours as needed. 08/17/17   Mickel Schreur, PA-C  naproxen (NAPROSYN) 375 MG  tablet Take 1 tablet (375 mg total) by mouth 2 (two) times daily. 04/15/17   Mardella Layman, MD  predniSONE (STERAPRED UNI-PAK 21 TAB) 10 MG (21) TBPK tablet Take 6 tablets PO day one, then 5, 4, 3, 2, 1 08/17/17   Khaliyah Northrop, PA-C  triamcinolone cream (KENALOG) 0.1 % Apply 1 application topically 2 (two) times daily. 08/17/17   Jermaine Tholl, PA-C    Family History No family history on file.  Social History Social History   Tobacco Use  . Smoking status: Current Every Day Smoker    Packs/day: 1.00  . Smokeless tobacco: Never Used  Substance Use Topics  . Alcohol use: Yes    Comment: occ  . Drug use: No     Allergies   Patient has no known allergies.   Review of Systems Review of Systems  Constitutional: Negative for chills and fever.  Skin: Positive for rash.     Physical Exam Updated Vital Signs BP (!) 147/103 (BP Location: Right Arm)   Pulse 86   Temp 97.8 F (36.6 C) (Oral)   Resp 18   SpO2 99%   Physical Exam  Constitutional: She is oriented to person, place, and time. She appears well-developed and well-nourished. No distress.  HENT:  Head: Normocephalic and atraumatic.  Eyes: EOM are normal.  Neck: Normal range of motion.  Pulmonary/Chest: Effort normal.  Abdominal: She exhibits no  distension.  Musculoskeletal: Normal range of motion.  Neurological: She is alert and oriented to person, place, and time.  Skin: Skin is warm. Rash noted.  Dry peeling rash on the palmar surface of both hands, worse on the left.  Areas of the skin are cracked due to dryness.  No obvious infections.  Rash is not spreading.  No red streaking.  Radial pulses intact bilaterally.  Sensation intact bilaterally.  Full active range of motion of the fingers and wrists.   Psychiatric: She has a normal mood and affect.  Nursing note and vitals reviewed.    ED Treatments / Results  Labs (all labs ordered are listed, but only abnormal results are displayed) Labs Reviewed -  No data to display  EKG  EKG Interpretation None       Radiology No results found.  Procedures Procedures (including critical care time)  Medications Ordered in ED Medications - No data to display   Initial Impression / Assessment and Plan / ED Course  I have reviewed the triage vital signs and the nursing notes.  Pertinent labs & imaging results that were available during my care of the patient were reviewed by me and considered in my medical decision making (see chart for details).     Pt presenting for evaluation of bilateral hand rash. Physical exam shows dry rash, ?dishydrotic eczema.  No signs of infection or cellulitis.  No systemic signs of infection.  Will try prednisone, Vistaril, and triamcinolone, as this improved her symptoms last time.  Stressed importance of follow-up with dermatologist, and patient states she will do so. Case discussed with J Hedges, PA-C, who evaluated the pt and agrees to plan. At this time, pt appears safe for discharge. Return precautions given. Pt states she understands and agrees to plan.    Final Clinical Impressions(s) / ED Diagnoses   Final diagnoses:  Rash of hands    ED Discharge Orders        Ordered    hydrOXYzine (ATARAX/VISTARIL) 25 MG tablet  Every 8 hours PRN     08/17/17 1622    predniSONE (STERAPRED UNI-PAK 21 TAB) 10 MG (21) TBPK tablet     08/17/17 1622    triamcinolone cream (KENALOG) 0.1 %  2 times daily     08/17/17 1622       Johnell Bas, PA-C 08/17/17 1723    Margarita Grizzleay, Danielle, MD 08/18/17 [phone]

## 2017-08-17 NOTE — ED Notes (Signed)
Presenting with bilateral, peeling, painful rash on hands. Pt unable to specify when the rash exactly started, but believes they came from the gloves she wore at work. Has not worn said gloves recently. States that rash has worsened since end of January.

## 2017-10-08 IMAGING — CT CT ANGIO CHEST
2 of 6 series · 18 of 36 positions shown · IV contrast (isovue)
Comparison: None.

CLINICAL DATA: Shortness of breath and chest pain, dizziness and
nausea, since [REDACTED].

EXAM:
CT ANGIOGRAPHY CHEST WITH CONTRAST
TECHNIQUE: Multidetector CT imaging of the chest was performed using the
standard protocol during bolus administration of intravenous
contrast. Multiplanar CT image reconstructions and MIPs were
obtained to evaluate the vascular anatomy.
CONTRAST:  100 mL Isovue 370

[Series 6: pe thins · axial · 0.51mm/px · z∈[+1174,+1377]mm · 17 of 229 slices shown]
[im 13/229  lung]
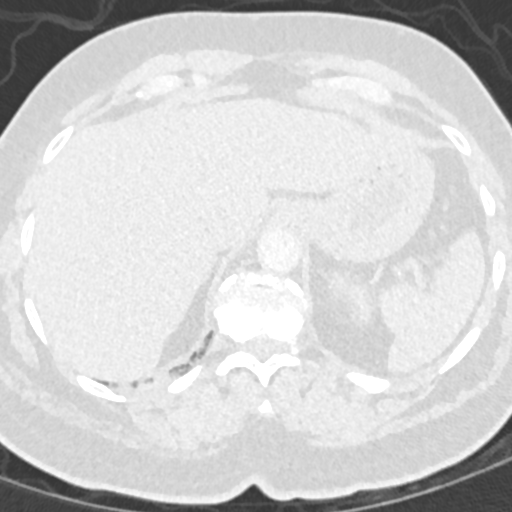
[im 26/229  mediastinal]
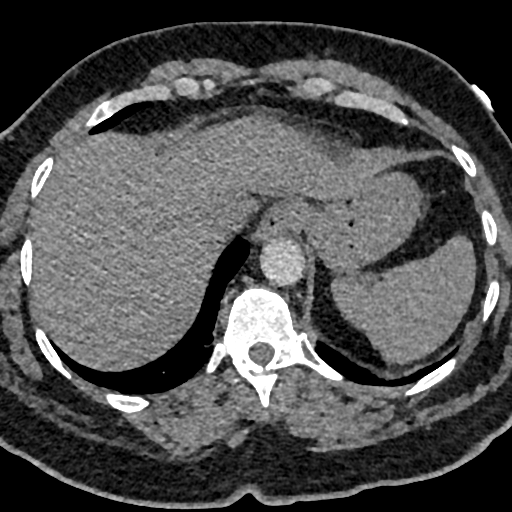
[im 39/229  lung]
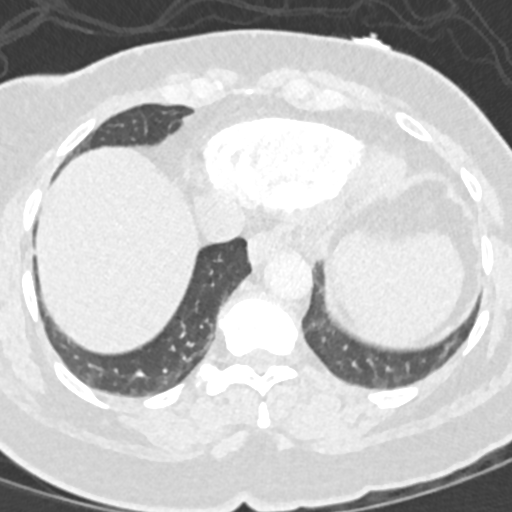
[im 51/229  mediastinal]
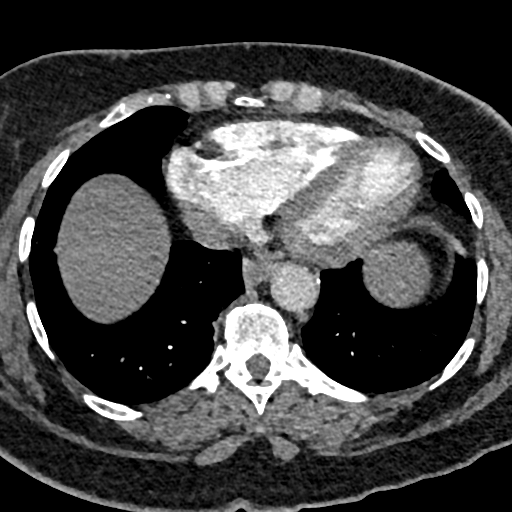
[im 64/229  lung]
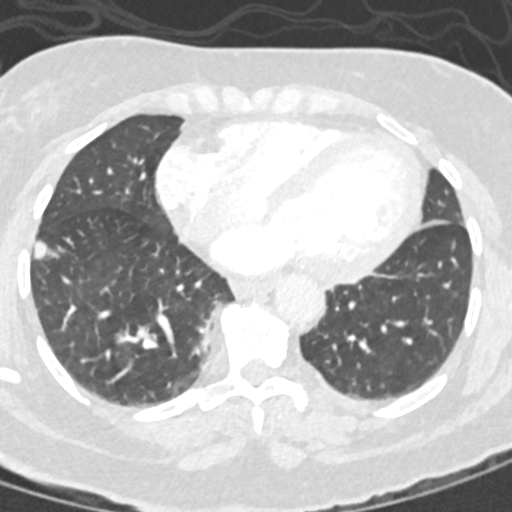
[im 77/229  mediastinal]
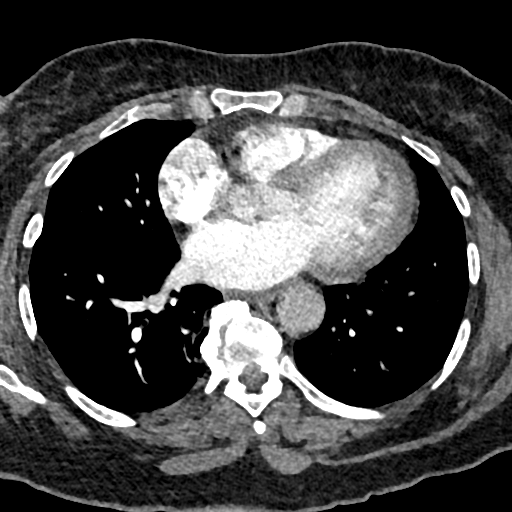
[im 89/229  lung]
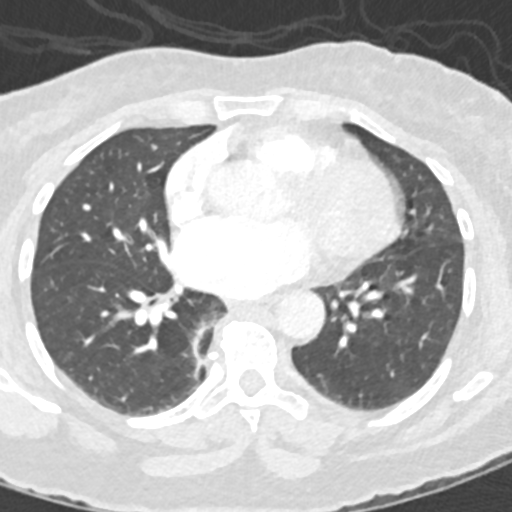
[im 102/229  mediastinal]
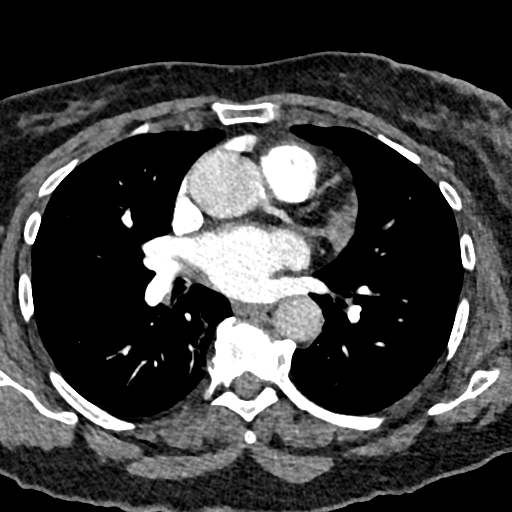
[im 115/229  lung]
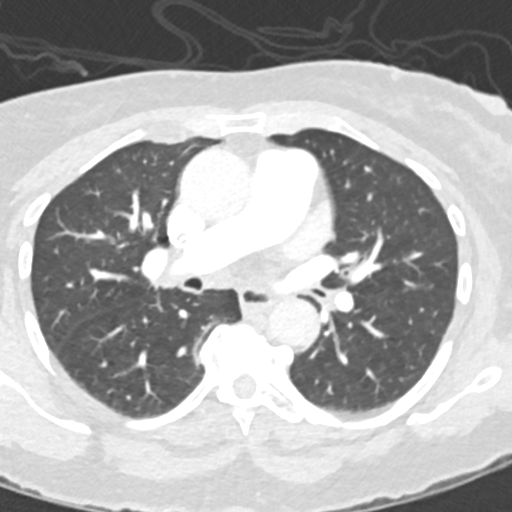
[im 127/229  mediastinal]
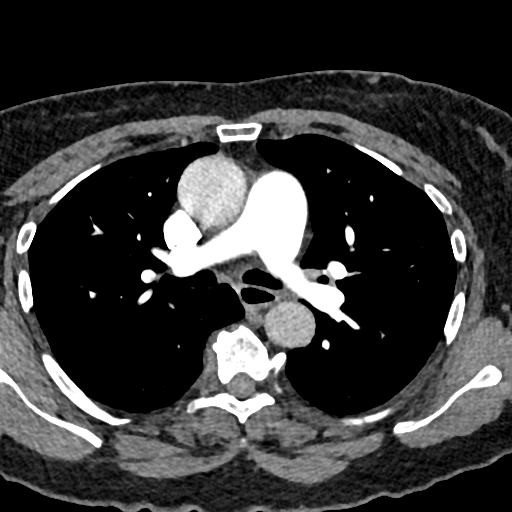
[im 140/229  lung]
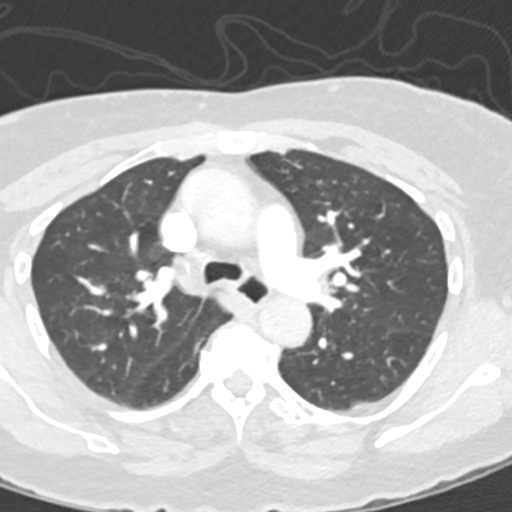
[im 153/229  mediastinal]
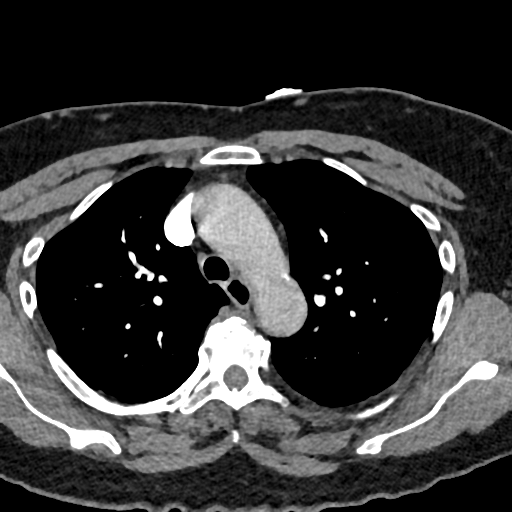
[im 165/229  lung]
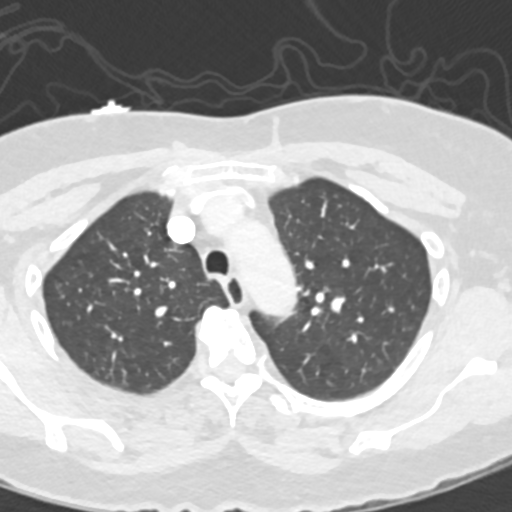
[im 178/229  mediastinal]
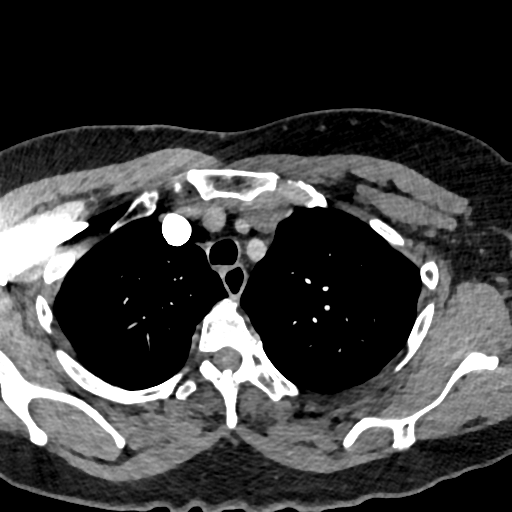
[im 191/229  lung]
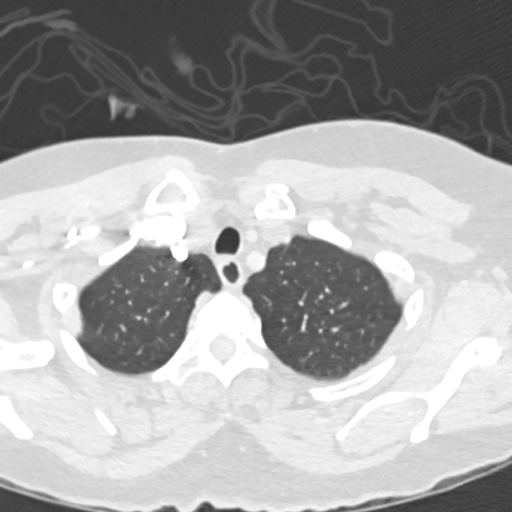
[im 203/229  mediastinal]
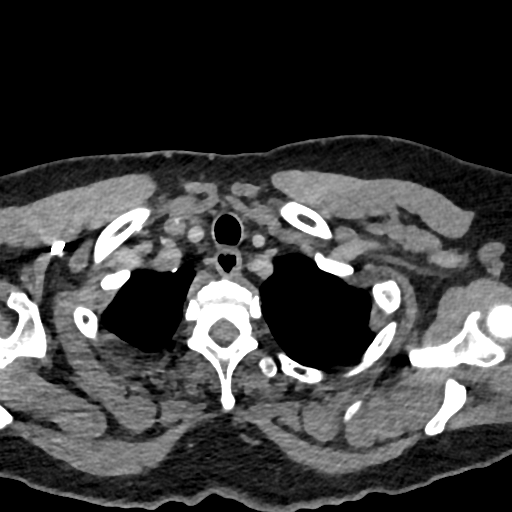
[im 216/229  lung]
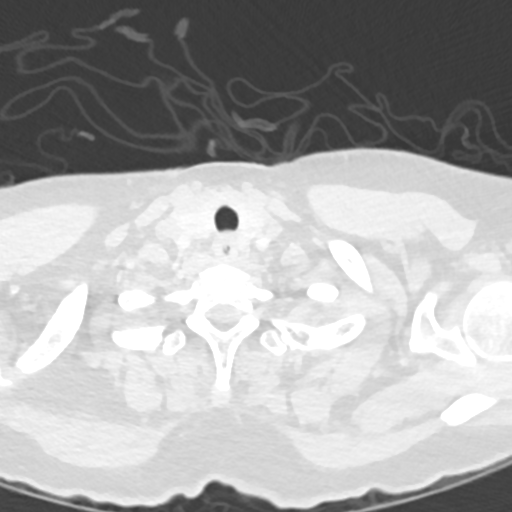

[Series 7: pe 2mm cor · coronal · 0.47mm/px · 1 of 146 slices shown]
[im 73/146  mediastinal]
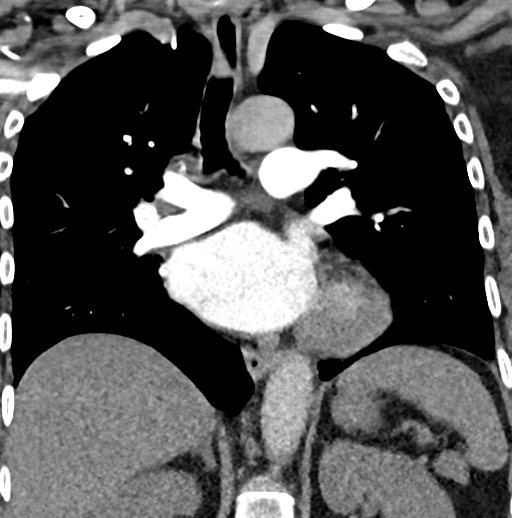

[18 of 36 positions shown; findings below may reference images not displayed]

FINDINGS: Cardiovascular: Satisfactory opacification of the pulmonary arteries
to the segmental level. No evidence of pulmonary embolism. Normal
heart size. No pericardial effusion.

Mediastinum/Nodes: No enlarged mediastinal, hilar, or axillary lymph
nodes. Thyroid gland, trachea, and esophagus demonstrate no
significant findings.

Lungs/Pleura: Mild atelectasis in the lung bases. In the right lung
base there is a 6 x 9 mm nodule (8 mm mean diameter) on image 84 of
series 5. This nodule is enlarging since the previous CT abdomen and
pelvis from 02/18/2012. Additional smaller nodules are present
without significant change. No focal airspace disease or
consolidation in the lungs. No pleural effusions. No pneumothorax.

Upper Abdomen: No acute abnormality.

Musculoskeletal: Degenerative changes in the spine. No destructive
bone lesions.

Review of the MIP images confirms the above findings.
IMPRESSION: No evidence of significant pulmonary embolus.

**An incidental finding of potential clinical significance has been
found. Enlarging nodule in the right lung base, now measuring 8 mm
mean diameter compared with 4.6 mm previously. Due to interval
enlargement, consider PET-CT scan for further evaluation. Otherwise,
a six-month follow-up study would be useful.**

## 2017-10-08 IMAGING — CR DG CHEST 2V
2 series · 2 of 2 positions shown · non-contrast
Comparison: 06/29/2014 and earlier.

CLINICAL DATA: [age] -year-old female with mid chest pain and
shortness of breath today. Initial encounter.

EXAM:
CHEST  2 VIEW

[chest pa]
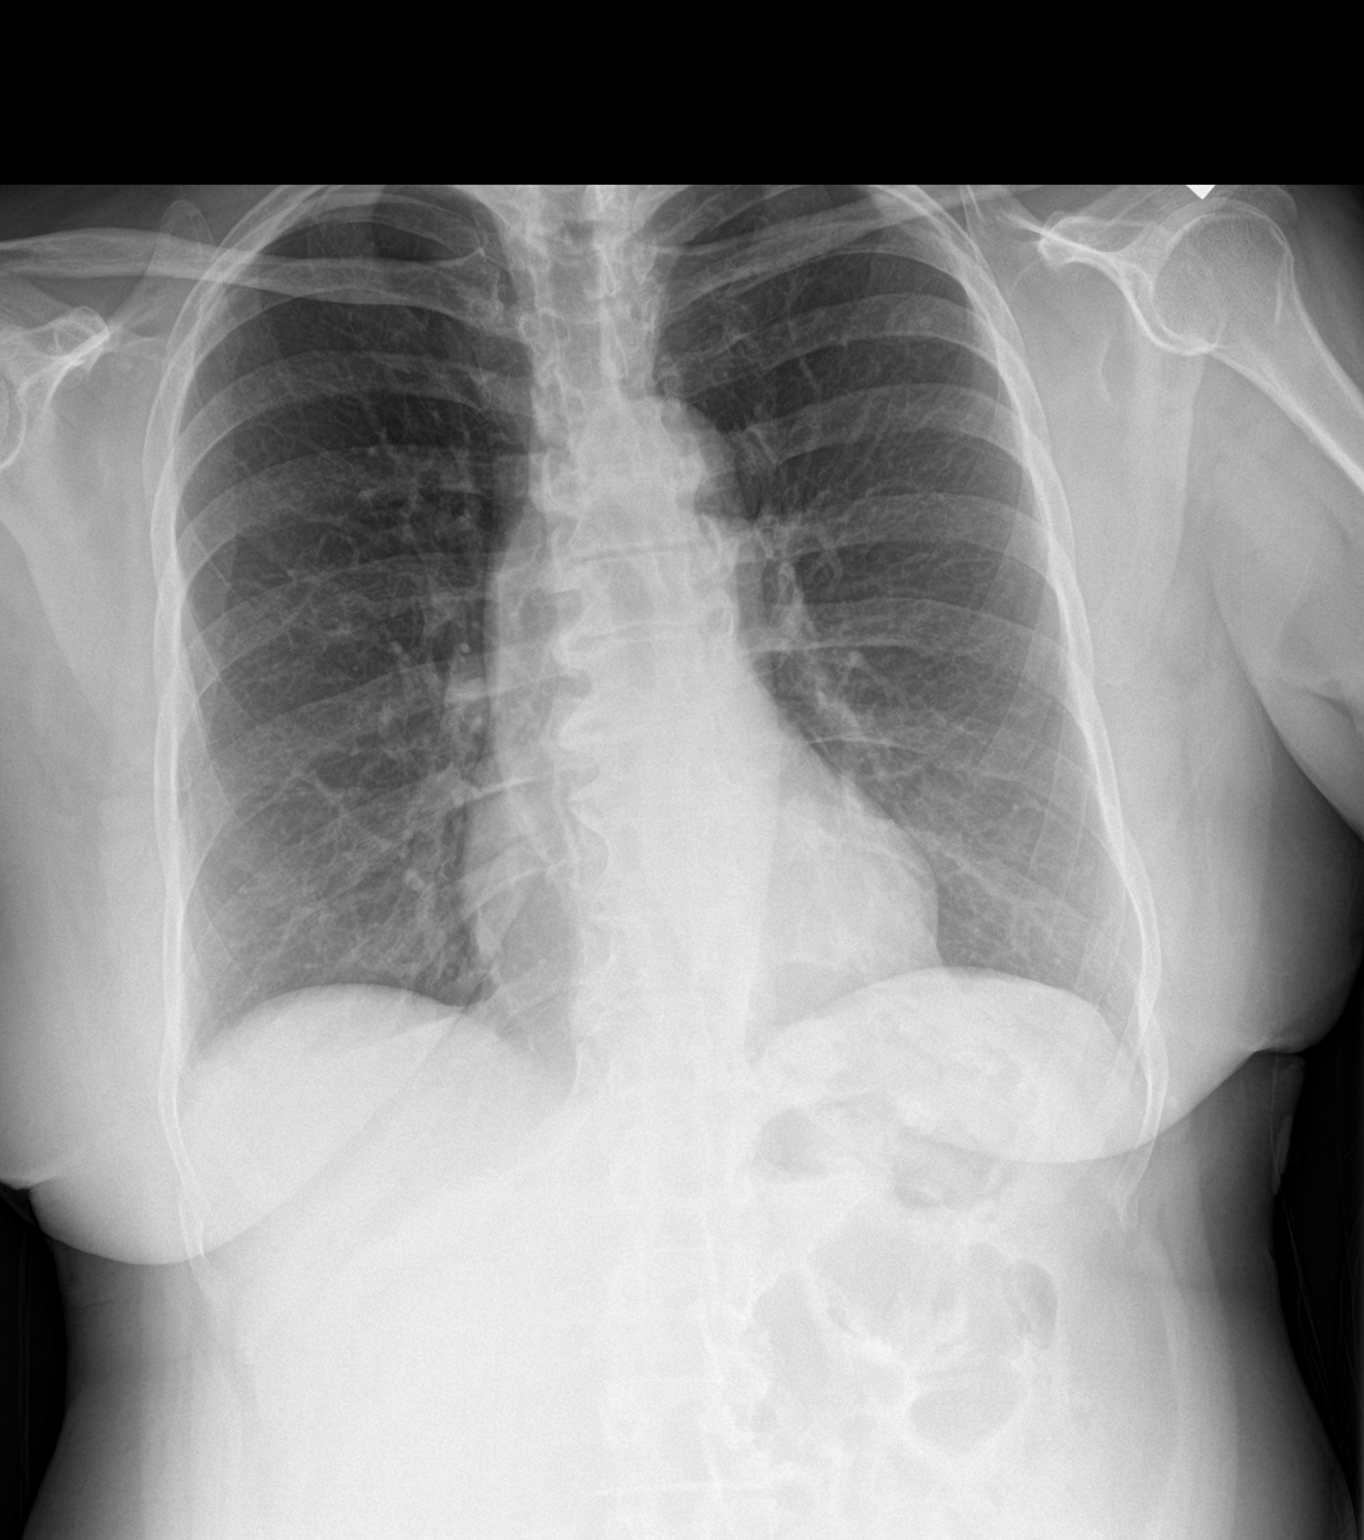

[chest lat]
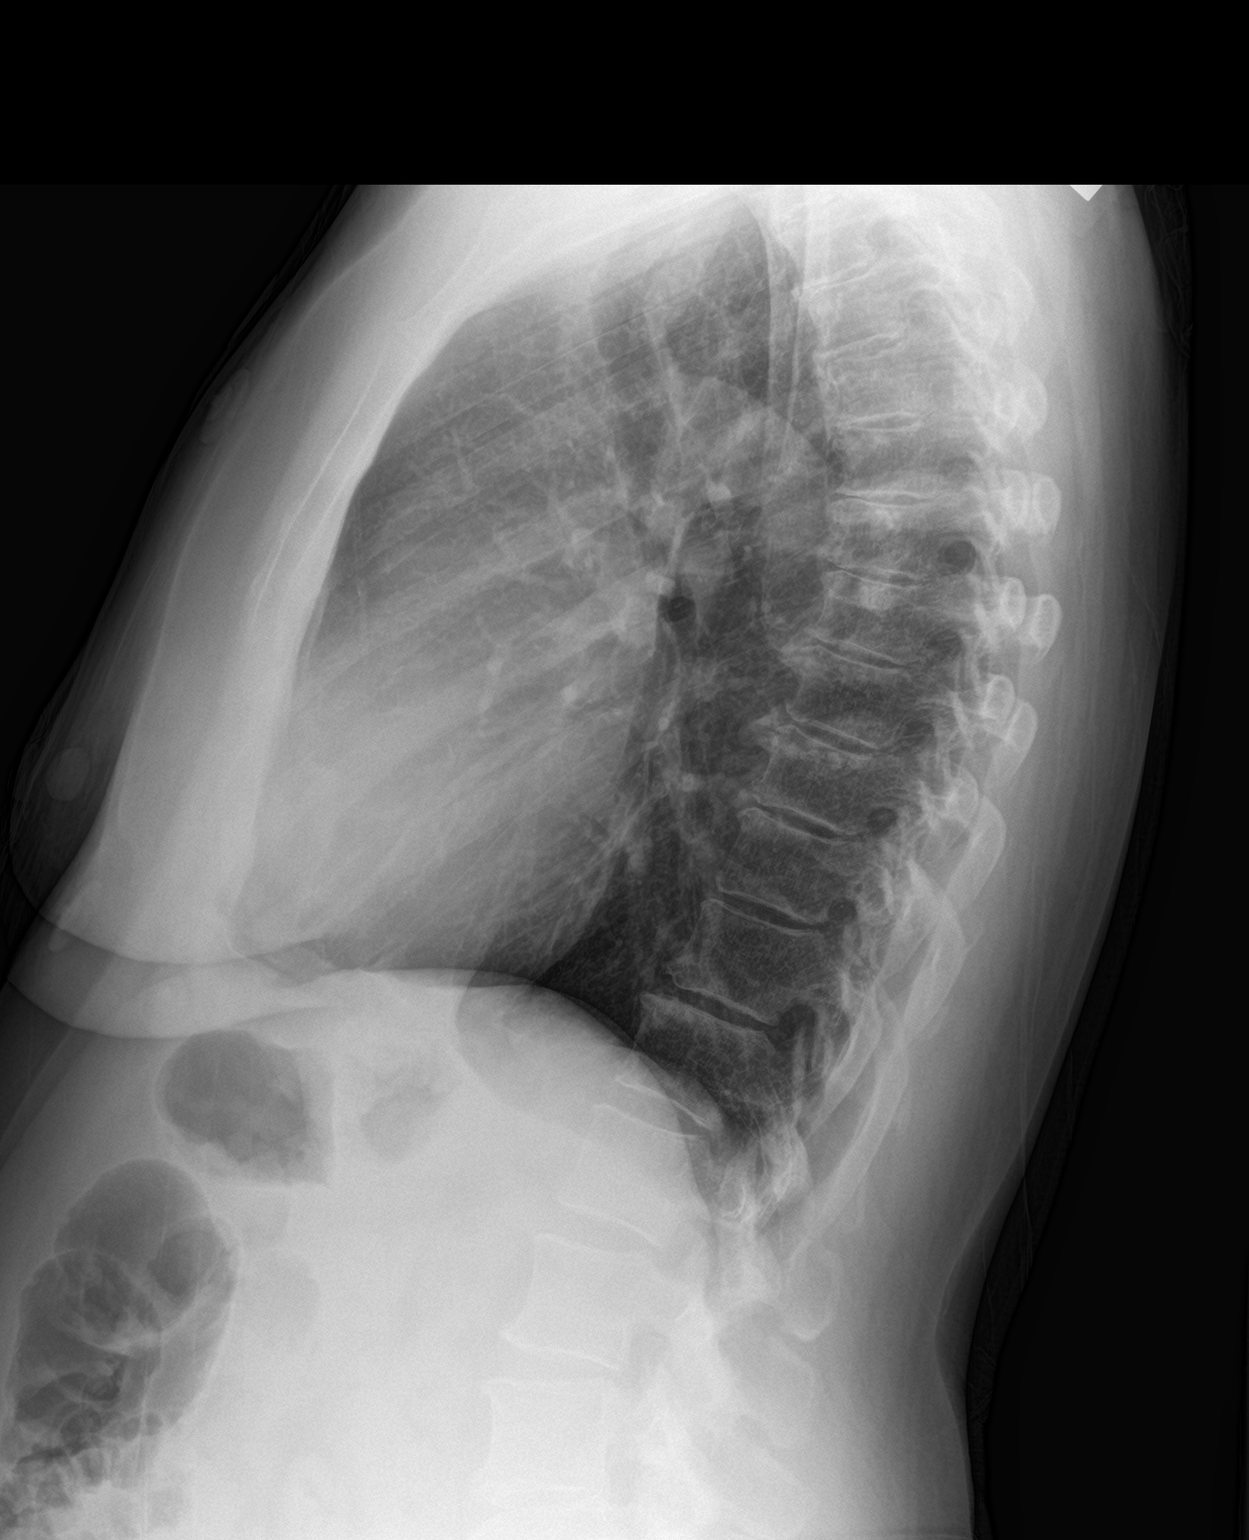

[2 of 2 positions shown; findings below may reference images not displayed]

FINDINGS: Stable cardiac size at the upper limits of normal. Other mediastinal
contours are within normal limits. Visualized tracheal air column is
within normal limits. Lung parenchyma is stable and clear. No
pneumothorax or pleural effusion. No acute osseous abnormality
identified. Negative visible bowel gas pattern.
IMPRESSION: No acute cardiopulmonary abnormality.

## 2017-10-29 ENCOUNTER — Ambulatory Visit (HOSPITAL_COMMUNITY)
Admission: EM | Admit: 2017-10-29 | Discharge: 2017-10-29 | Disposition: A | Discharge status: Home / Self Care | Payer: Self-pay | Attending: Internal Medicine | Admitting: Internal Medicine

## 2017-10-29 ENCOUNTER — Encounter (HOSPITAL_COMMUNITY): Payer: Self-pay | Admitting: Family Medicine

## 2017-10-29 DIAGNOSIS — L309 Dermatitis, unspecified: Secondary | ICD-10-CM

## 2017-10-29 MED ORDER — TRIAMCINOLONE ACETONIDE 0.1 % EX CREA: 1.0000 "application " | TOPICAL_CREAM | Freq: Two times a day (BID) | CUTANEOUS | 0 refills | Status: DC

## 2017-10-29 MED ORDER — PREDNISONE 10 MG (21) PO TBPK: ORAL_TABLET | ORAL | 0 refills | Status: DC

## 2017-10-29 MED ORDER — FLUOCINONIDE-E 0.05 % EX CREA: 1.0000 "application " | TOPICAL_CREAM | Freq: Three times a day (TID) | CUTANEOUS | 0 refills | Status: DC

## 2017-10-29 NOTE — ED Provider Notes (Signed)
[facility]    CSN: [id number] Arrival date & time: 10/29/17  1303     History   Chief Complaint Chief Complaint  Patient presents with  . Rash    HPI [name] is a 62 y.o. female.   [name] presents with complaints of itching rash to her hands, arms, chest, thighs, feet which has been ongoing for months now. Seen in the past and provided with prednisone, triamcinolone and emmollient for dyshidrotic eczema. 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 Itching is worse after showering. Does use baby oi as well as cocoa butter to skin. No new products or allergen exposures known. Hx of htn.    ROS per HPI.      Past Medical History:  Diagnosis Date  . Hypertension     There are no active problems to display for this patient.   Past Surgical History:  Procedure Laterality Date  . ABDOMINAL HYSTERECTOMY      OB History   None      Home Medications    Prior to Admission medications   Medication Sig Start Date End Date Taking? Authorizing Provider  fluocinonide-emollient (LIDEX-E) 0.05 % cream Apply 1 application topically 3 (three) times daily. To hands 10/29/17   Linus Mako B, NP  hydrOXYzine (ATARAX/VISTARIL) 25 MG tablet Take 1 tablet (25 mg total) by mouth every 8 (eight) hours as needed. 08/17/17   Caccavale, Sophia, PA-C  naproxen (NAPROSYN) 375 MG tablet Take 1 tablet (375 mg total) by mouth 2 (two) times daily. 04/15/17   Mardella Layman, MD  predniSONE (STERAPRED UNI-PAK 21 TAB) 10 MG (21) TBPK tablet Take 6 tablets Orally day one, then 5, 4, 3, 2, 1 10/29/17   Linus Mako B, NP  triamcinolone cream (KENALOG) 0.1 % Apply 1 application topically 2 (two) times daily. 10/29/17   Georgetta Haber, NP    Family History History reviewed. No pertinent family history.  Social History Social History   Tobacco Use  . Smoking status: Current Every Day Smoker    Packs/day: 1.00  . Smokeless tobacco: Never Used  Substance Use  Topics  . Alcohol use: Yes    Comment: occ  . Drug use: No     Allergies   Patient has no known allergies.   Review of Systems Review of Systems   Physical Exam Triage Vital Signs ED Triage Vitals  Enc Vitals Group     BP 10/29/17 1346 (!) 163/96     Pulse Rate 10/29/17 1346 79     Resp 10/29/17 1346 18     Temp 10/29/17 1346 98.5 F (36.9 C)     Temp src --      SpO2 10/29/17 1346 100 %     Weight --      Height --      Head Circumference --      Peak Flow --      Pain Score 10/29/17 1345 0     Pain Loc --      Pain Edu? --      Excl. in GC? --    No data found.  Updated Vital Signs BP (!) 163/96   Pulse 79   Temp 98.5 F (36.9 C)   Resp 18   SpO2 100%    Physical Exam  Constitutional: She is oriented to person, place, and time. She appears well-developed and well-nourished. No distress.  Cardiovascular: Normal rate, regular rhythm and normal heart sounds.  Pulmonary/Chest: Effort normal and  breath sounds normal.  Neurological: She is alert and oriented to person, place, and time.  Skin: Skin is warm and dry. Rash noted.  Red scaly dry patchy rashes noted to thighs, chest, feet as well as upper arms with flaking noted to palms of hands; see photos              UC Treatments / Results  Labs (all labs ordered are listed, but only abnormal results are displayed) Labs Reviewed - No data to display  EKG None Radiology No results found.  Procedures Procedures (including critical care time)  Medications Ordered in UC Medications - No data to display   Initial Impression / Assessment and Plan / UC Course  I have reviewed the triage vital signs and the nursing notes.  Pertinent labs & imaging results that were available during my care of the patient were reviewed by me and considered in my medical decision making (see chart for details).     Remains consistent with likely eczema. Prednisone taper as well as topical treatments provided.  Discussed skin care with patient. To establish with PCP for management as needed. Patient verbalized understanding and agreeable to plan.    Final Clinical Impressions(s) / UC Diagnoses   Final diagnoses:  Eczema, unspecified type    ED Discharge Orders        Ordered    predniSONE (STERAPRED UNI-PAK 21 TAB) 10 MG (21) TBPK tablet     10/29/17 1420    triamcinolone cream (KENALOG) 0.1 %  2 times daily     10/29/17 1420    fluocinonide-emollient (LIDEX-E) 0.05 % cream  3 times daily     10/29/17 1420       Controlled Substance Prescriptions [place] Controlled Substance Registry consulted? Not Applicable   Georgetta HaberBurky, Natalie B, NP 10/29/17 1426

## 2017-10-29 NOTE — Discharge Instructions (Signed)
Continue with use of baby oil/ lotions, immediately after showering. Complete course of prednisone. Use of kenalog cream to lesions twice a day. Lidex-e cream to hands three times a day. Please establish with a primary care provider for long term management of your skin care.

## 2017-10-29 NOTE — ED Triage Notes (Signed)
Pt here for rash all over her body. sts itching and burning. She hasn't taken anything for the rash.

## 2018-12-14 ENCOUNTER — Ambulatory Visit (HOSPITAL_COMMUNITY)
Admission: EM | Admit: 2018-12-14 | Discharge: 2018-12-14 | Disposition: A | Discharge status: Home / Self Care | Payer: BC Managed Care – PPO | Attending: Family Medicine | Admitting: Family Medicine

## 2018-12-14 ENCOUNTER — Other Ambulatory Visit: Payer: Self-pay

## 2018-12-14 ENCOUNTER — Encounter (HOSPITAL_COMMUNITY): Payer: Self-pay

## 2018-12-14 DIAGNOSIS — X500XXA Overexertion from strenuous movement or load, initial encounter: Secondary | ICD-10-CM

## 2018-12-14 DIAGNOSIS — S46811A Strain of other muscles, fascia and tendons at shoulder and upper arm level, right arm, initial encounter: Secondary | ICD-10-CM

## 2018-12-14 MED ORDER — KETOROLAC TROMETHAMINE 30 MG/ML IJ SOLN
INTRAMUSCULAR | Status: AC
  Filled 2018-12-14: qty 1

## 2018-12-14 MED ORDER — CYCLOBENZAPRINE HCL 5 MG PO TABS: 5.0000 mg | ORAL_TABLET | Freq: Two times a day (BID) | ORAL | 0 refills | Status: DC | PRN

## 2018-12-14 MED ORDER — KETOROLAC TROMETHAMINE 30 MG/ML IJ SOLN
30.0000 mg | Freq: Once | INTRAMUSCULAR | Status: AC
  Administered 2018-12-14: 30 mg via INTRAMUSCULAR

## 2018-12-14 MED ORDER — IBUPROFEN 600 MG PO TABS: 600.0000 mg | ORAL_TABLET | Freq: Four times a day (QID) | ORAL | 0 refills | Status: DC | PRN

## 2018-12-14 NOTE — Discharge Instructions (Signed)
We gave you a shot of toradol today  Use anti-inflammatories for pain/swelling. You may take up to 600 mg Ibuprofen every 8 hours with food. You may supplement Ibuprofen with Tylenol [phone] mg every 8 hours.   You may use flexeril as needed to help with pain. This is a muscle relaxer and causes sedation- please use only at bedtime or when you will be home and not have to drive/work  Gentle stretching/movement of arm  Follow up if not improving or wrosening

## 2018-12-14 NOTE — ED Triage Notes (Signed)
Pt presents with ongoing chronic right shoulder pain.

## 2018-12-14 NOTE — ED Provider Notes (Signed)
[facility]    CSN: [id number] Arrival date & time: 12/14/18  1055     History   Chief Complaint Chief Complaint  Patient presents with  . Shoulder Pain    HPI [name] is a 63 y.o. female history of hypertension presenting today for evaluation of right shoulder pain.  Patient states that over the past week she has had progressively worsening right shoulder pain.  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  She states that she has had increased soreness in her shoulder/back.  She has not taken any medicines for her symptoms.  Denies numbness or tingling.  Denies weakness.  Denies previous injury to shoulder.  Denies pain with moving neck.  Denies lightheadedness or dizziness.  HPI  Past Medical History:  Diagnosis Date  . Hypertension     There are no active problems to display for this patient.   Past Surgical History:  Procedure Laterality Date  . ABDOMINAL HYSTERECTOMY      OB History   No obstetric history on file.      Home Medications    Prior to Admission medications   Medication Sig Start Date End Date Taking? Authorizing Provider  cyclobenzaprine (FLEXERIL) 5 MG tablet Take 1-2 tablets (5-10 mg total) by mouth 2 (two) times daily as needed for muscle spasms. 12/14/18   Yolande Skoda C, PA-C  ibuprofen (ADVIL) 600 MG tablet Take 1 tablet (600 mg total) by mouth every 6 (six) hours as needed. 12/14/18   Letitia Sabala, Junius CreamerHallie C, PA-C    Family History Family History  Family history unknown: Yes    Social History Social History   Tobacco Use  . Smoking status: Current Every Day Smoker    Packs/day: 1.00  . Smokeless tobacco: Never Used  Substance Use Topics  . Alcohol use: Yes    Comment: occ  . Drug use: No     Allergies   Patient has no known allergies.   Review of Systems Review of Systems  Constitutional: Negative for fatigue and fever.  Eyes: Negative  for visual disturbance.  Respiratory: Negative for shortness of breath.   Cardiovascular: Negative for chest pain.  Gastrointestinal: Negative for abdominal pain, nausea and vomiting.  Musculoskeletal: Positive for arthralgias, back pain and myalgias. Negative for joint swelling.  Skin: Negative for color change, rash and wound.  Neurological: Negative for dizziness, weakness, light-headedness and headaches.     Physical Exam Triage Vital Signs ED Triage Vitals  Enc Vitals Group     BP 12/14/18 1111 (!) 143/88     Pulse Rate 12/14/18 1111 92     Resp 12/14/18 1111 18     Temp 12/14/18 1111 97.7 F (36.5 C)     Temp Source 12/14/18 1111 Oral     SpO2 12/14/18 1111 97 %     Weight --      Height --      Head Circumference --      Peak Flow --      Pain Score 12/14/18 1112 7     Pain Loc --      Pain Edu? --      Excl. in GC? --    No data found.  Updated Vital Signs BP (!) 143/88 (BP Location: Left Arm)   Pulse 92   Temp 97.7 F (36.5 C) (Oral)   Resp 18   SpO2 97%   Visual Acuity Right Eye  Distance:   Left Eye Distance:   Bilateral Distance:    Right Eye Near:   Left Eye Near:    Bilateral Near:     Physical Exam Vitals signs and nursing note reviewed.  Constitutional:      General: She is not in acute distress.    Appearance: She is well-developed.  HENT:     Head: Normocephalic and atraumatic.  Eyes:     Conjunctiva/sclera: Conjunctivae normal.  Neck:     Musculoskeletal: Neck supple.  Cardiovascular:     Rate and Rhythm: Normal rate and regular rhythm.     Heart sounds: No murmur.  Pulmonary:     Effort: Pulmonary effort is normal. No respiratory distress.     Breath sounds: Normal breath sounds.     Comments: Breathing comfortably at rest, CTABL, no wheezing, rales or other adventitious sounds auscultated Abdominal:     Palpations: Abdomen is soft.     Tenderness: There is no abdominal tenderness.  Musculoskeletal:     Comments: Nontender to  palpation of cervical spine midline increased tenderness throughout thoracic portion of trapezius musculature  Nontender palpation along clavicle, AC joint and scapular spine Relatively full active range of motion of shoulder although movement above 90 does elicit pain Strength 5/5 and equal bilaterally at shoulders, grip strength 5/5 and equal bilaterally Negative liftoff, negative resisted external rotation  Skin:    General: Skin is warm and dry.  Neurological:     Mental Status: She is alert.      UC Treatments / Results  Labs (all labs ordered are listed, but only abnormal results are displayed) Labs Reviewed - No data to display  EKG None  Radiology No results found.  Procedures Procedures (including critical care time)  Medications Ordered in UC Medications  ketorolac (TORADOL) 30 MG/ML injection 30 mg (30 mg Intramuscular Given 12/14/18 1141)    Initial Impression / Assessment and Plan / UC Course  I have reviewed the triage vital signs and the nursing notes.  Pertinent labs & imaging results that were available during my care of the patient were reviewed by me and considered in my medical decision making (see chart for details).     Patient with reproducible tenderness over trapezius musculature.  Most likely muscular cause of pain.  Do not suspect underlying bony abnormality given lacking mechanism of injury and full active range of motion, nontender over bony prominences.  Recommending anti-inflammatories and muscle relaxers.  Provided ibuprofen and Flexeril to use at home.  Will provide Toradol prior to discharge.  Continue to monitor, gradual stretching.  Avoid heavy lifting temporarily.  Discussed strict return precautions. Patient verbalized understanding and is agreeable with plan.  Final Clinical Impressions(s) / UC Diagnoses   Final diagnoses:  Strain of right trapezius muscle, initial encounter     Discharge Instructions     We gave you a shot of  toradol today  Use anti-inflammatories for pain/swelling. You may take up to 600 mg Ibuprofen every 8 hours with food. You may supplement Ibuprofen with Tylenol [phone] mg every 8 hours.   You may use flexeril as needed to help with pain. This is a muscle relaxer and causes sedation- please use only at bedtime or when you will be home and not have to drive/work  Gentle stretching/movement of arm  Follow up if not improving or wrosening   ED Prescriptions    Medication Sig Dispense Auth. Provider   ibuprofen (ADVIL) 600 MG tablet Take 1 tablet (  600 mg total) by mouth every 6 (six) hours as needed. 30 tablet Keasia Dubose C, PA-C   cyclobenzaprine (FLEXERIL) 5 MG tablet Take 1-2 tablets (5-10 mg total) by mouth 2 (two) times daily as needed for muscle spasms. 24 tablet Mahari Vankirk, OketoHallie C, PA-C     Controlled Substance Prescriptions [place] Controlled Substance Registry consulted? Not Applicable   Lew DawesWieters, Rich Paprocki C, [place]PA-C 12/14/18 1307

## 2018-12-29 ENCOUNTER — Encounter (HOSPITAL_COMMUNITY): Payer: Self-pay

## 2018-12-29 ENCOUNTER — Ambulatory Visit (HOSPITAL_COMMUNITY)
Admission: EM | Admit: 2018-12-29 | Discharge: 2018-12-29 | Disposition: A | Discharge status: Home / Self Care | Payer: BC Managed Care – PPO | Attending: Family Medicine | Admitting: Family Medicine

## 2018-12-29 ENCOUNTER — Other Ambulatory Visit: Payer: Self-pay

## 2018-12-29 DIAGNOSIS — L301 Dyshidrosis [pompholyx]: Secondary | ICD-10-CM | POA: Diagnosis not present

## 2018-12-29 MED ORDER — FLUOCINONIDE-E 0.05 % EX CREA: 1.0000 "application " | TOPICAL_CREAM | Freq: Two times a day (BID) | CUTANEOUS | 0 refills | Status: DC

## 2018-12-29 MED ORDER — METHYLPREDNISOLONE SODIUM SUCC 125 MG IJ SOLR
40.0000 mg | Freq: Once | INTRAMUSCULAR | Status: AC
  Administered 2018-12-29: 40 mg via INTRAMUSCULAR

## 2018-12-29 MED ORDER — TRIAMCINOLONE ACETONIDE 0.5 % EX OINT: 1.0000 "application " | TOPICAL_OINTMENT | Freq: Two times a day (BID) | CUTANEOUS | 0 refills | Status: DC

## 2018-12-29 MED ORDER — METHYLPREDNISOLONE SODIUM SUCC 125 MG IJ SOLR
INTRAMUSCULAR | Status: AC
  Filled 2018-12-29: qty 2

## 2018-12-29 NOTE — ED Provider Notes (Signed)
[facility]    CSN: [id number] Arrival date & time: 12/29/18  1128     History   Chief Complaint Chief Complaint  Patient presents with  . Rash    HPI Artice Holohan is a 63 y.o. female history of eczema, obesity and hypertension presenting for left hand rash that has been spreading up her arm "for days ".  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  Patient has tried calamine lotion, oatmeal soaks, lotion with minimal relief.  Patient has been seen for this in the past, last on 10/29/2017.  HPI as below:  "Journiee presents with complaints of itching rash to her hands, arms, chest, thighs, feet which has been ongoing for months now. Seen in the past and provided with prednisone, triamcinolone and emmollient for dyshidrotic eczema. She works in a [employer], general practice frequently. Itching is worse after showering. Does use baby oi as well as cocoa butter to skin. No new products or allergen exposures known. Hx of htn."  She states that her rash this time is "not as bad as back then ".  Patient was given prednisone taper pack, Kenalog cream, Lidex cream with adequate relief of symptoms.  She does not have this on hand, requesting refill.   Past Medical History:  Diagnosis Date  . Hypertension     There are no active problems to display for this patient.   Past Surgical History:  Procedure Laterality Date  . ABDOMINAL HYSTERECTOMY      OB History   No obstetric history on file.      Home Medications    Prior to Admission medications   Medication Sig Start Date End Date Taking? Authorizing Provider  cyclobenzaprine (FLEXERIL) 5 MG tablet Take 1-2 tablets (5-10 mg total) by mouth 2 (two) times daily as needed for muscle spasms. 12/14/18  Yes Wieters, Hallie C, PA-C  ibuprofen (ADVIL) 600 MG tablet Take 1 tablet (600 mg total) by mouth every 6 (six) hours as needed. 12/14/18  Yes Wieters, Hallie C, PA-C   fluocinonide-emollient (LIDEX-E) 0.05 % cream Apply 1 application topically 2 (two) times daily. 12/29/18   Hall-Potvin, [place], PA-C  triamcinolone ointment (KENALOG) 0.5 % Apply 1 application topically 2 (two) times daily. 12/29/18   Hall-Potvin, [place], PA-C    Family History Family History  Problem Relation Age of Onset  . Healthy Mother   . Healthy Father     Social History Social History   Tobacco Use  . Smoking status: Current Every Day Smoker    Packs/day: 1.00  . Smokeless tobacco: Never Used  Substance Use Topics  . Alcohol use: Yes    Comment: occ  . Drug use: No     Allergies   Patient has no known allergies.   Review of Systems As per HPI   Physical Exam Triage Vital Signs ED Triage Vitals  Enc Vitals Group     BP 12/29/18 1226 (!) 156/97     Pulse Rate 12/29/18 1226 74     Resp 12/29/18 1226 18     Temp 12/29/18 1226 98.2 F (36.8 C)     Temp src --      SpO2 12/29/18 1226 93 %     Weight --      Height --      Head Circumference --      Peak Flow --      Pain Score 12/29/18 1221 0  Pain Loc --      Pain Edu? --      Excl. in GC? --    No data found.  Updated Vital Signs BP (!) 156/97   Pulse 74   Temp 98.2 F (36.8 C)   Resp 18   SpO2 93%   Visual Acuity Right Eye Distance:   Left Eye Distance:   Bilateral Distance:    Right Eye Near:   Left Eye Near:    Bilateral Near:     Physical Exam Constitutional:      General: She is not in acute distress. HENT:     Head: Normocephalic and atraumatic.  Eyes:     General: No scleral icterus.    Pupils: Pupils are equal, round, and reactive to light.  Cardiovascular:     Rate and Rhythm: Normal rate.  Pulmonary:     Effort: Pulmonary effort is normal.  Skin:    Coloration: Skin is not jaundiced or pale.     Comments: Dry, erythematous skin with scattered papules along left hand, forearm.  Some noted on right hand as well.  Lesions are nontender, without opening or  drainage.  No warmth, streaking.  Neurological:     Mental Status: She is alert and oriented to person, place, and time.      UC Treatments / Results  Labs (all labs ordered are listed, but only abnormal results are displayed) Labs Reviewed - No data to display  EKG None  Radiology No results found.  Procedures Procedures (including critical care time)  Medications Ordered in UC Medications  methylPREDNISolone sodium succinate (SOLU-MEDROL) 125 mg/2 mL injection 40 mg (40 mg Intramuscular Given 12/29/18 1347)  methylPREDNISolone sodium succinate (SOLU-MEDROL) 125 mg/2 mL injection (has no administration in time range)    Initial Impression / Assessment and Plan / UC Course  I have reviewed the triage vital signs and the nursing notes.  Pertinent labs & imaging results that were available during my care of the patient were reviewed by me and considered in my medical decision making (see chart for details).     63 year old female with history of eczema, obesity, hypertension presenting for left hand/arm rash.  History and exam consistent with eczema, patient was given IM Solu-Medrol in office, tolerated this well.  Will refill Kenalog ointment and Lidex cream.  Discussed return precautions, patient verbalized understanding. Final Clinical Impressions(s) / UC Diagnoses   Final diagnoses:  Dyshidrotic eczema     Discharge Instructions     Use lotions as directed. You were given steroid shot in office today. Follow-up with PCP should symptoms return, worsen.    ED Prescriptions    Medication Sig Dispense Auth. Provider   triamcinolone ointment (KENALOG) 0.5 % Apply 1 application topically 2 (two) times daily. 30 g Hall-Potvin, [place], PA-C   fluocinonide-emollient (LIDEX-E) 0.05 % cream Apply 1 application topically 2 (two) times daily. 30 g Hall-Potvin, [place], PA-C     Controlled Substance Prescriptions [place] Controlled Substance Registry consulted? Not  Applicable   Shea EvansHall-Potvin, , [place]PA-C 12/30/18 1543

## 2018-12-29 NOTE — Discharge Instructions (Signed)
Use lotions as directed. You were given steroid shot in office today. Follow-up with PCP should symptoms return, worsen.

## 2018-12-29 NOTE — ED Triage Notes (Signed)
Pt presents with complaints of rash to left hand that spreads up her arm. Reports it has been going on for "days". 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

## 2018-12-30 ENCOUNTER — Encounter (HOSPITAL_COMMUNITY): Payer: Self-pay | Admitting: Emergency Medicine

## 2019-03-04 ENCOUNTER — Other Ambulatory Visit: Payer: Self-pay

## 2019-03-04 ENCOUNTER — Encounter (HOSPITAL_COMMUNITY): Payer: Self-pay | Admitting: Emergency Medicine

## 2019-03-04 ENCOUNTER — Ambulatory Visit (INDEPENDENT_AMBULATORY_CARE_PROVIDER_SITE_OTHER): Payer: BC Managed Care – PPO

## 2019-03-04 ENCOUNTER — Telehealth (HOSPITAL_COMMUNITY): Payer: Self-pay

## 2019-03-04 ENCOUNTER — Ambulatory Visit (HOSPITAL_COMMUNITY)
Admission: EM | Admit: 2019-03-04 | Discharge: 2019-03-04 | Disposition: A | Discharge status: Home / Self Care | Payer: BC Managed Care – PPO | Attending: Physician Assistant | Admitting: Physician Assistant

## 2019-03-04 DIAGNOSIS — M25511 Pain in right shoulder: Secondary | ICD-10-CM | POA: Diagnosis not present

## 2019-03-04 DIAGNOSIS — M19011 Primary osteoarthritis, right shoulder: Secondary | ICD-10-CM | POA: Diagnosis not present

## 2019-03-04 DIAGNOSIS — M25561 Pain in right knee: Secondary | ICD-10-CM | POA: Diagnosis not present

## 2019-03-04 MED ORDER — MELOXICAM 7.5 MG PO TABS: 7.5000 mg | ORAL_TABLET | Freq: Every day | ORAL | 1 refills | Status: DC

## 2019-03-04 MED ORDER — METHOCARBAMOL 500 MG PO TABS: 500.0000 mg | ORAL_TABLET | Freq: Two times a day (BID) | ORAL | 0 refills | Status: DC

## 2019-03-04 MED ORDER — MELOXICAM 7.5 MG PO TABS: 7.5000 mg | ORAL_TABLET | Freq: Every day | ORAL | 1 refills | Status: AC

## 2019-03-04 NOTE — ED Triage Notes (Signed)
Pt sts right shoulder and right knee pain chronic in nature

## 2019-03-04 NOTE — ED Provider Notes (Signed)
[facility]    CSN: [id number] Arrival date & time: 03/04/19  1045      History   Chief Complaint Chief Complaint  Patient presents with  . Shoulder Pain  . Knee Pain    HPI [name] is a 63 y.o. female.   The history is provided by the patient. No language interpreter was used.  Shoulder Pain Location:  Shoulder Shoulder location:  R shoulder Injury: no   Pain details:    Quality:  Aching   Radiates to:  Does not radiate   Severity:  Moderate   Onset quality:  Gradual   Timing:  Constant   Progression:  Worsening Foreign body present:  No foreign bodies Tetanus status:  Up to date Relieved by:  Nothing Worsened by:  Nothing Ineffective treatments:  None tried Associated symptoms: no neck pain   Risk factors: no known bone disorder   Knee Pain Associated symptoms: no neck pain   Pt complains of pain in and swelling in her right knee.  Pt reports she has not had xrays.  Past Medical History:  Diagnosis Date  . Hypertension     There are no active problems to display for this patient.   Past Surgical History:  Procedure Laterality Date  . ABDOMINAL HYSTERECTOMY      OB History   No obstetric history on file.      Home Medications    Prior to Admission medications   Medication Sig Start Date End Date Taking? Authorizing Provider  fluocinonide-emollient (LIDEX-E) 0.05 % cream Apply 1 application topically 2 (two) times daily. 12/29/18   Hall-Potvin, [place], PA-C  ibuprofen (ADVIL) 600 MG tablet Take 1 tablet (600 mg total) by mouth every 6 (six) hours as needed. 12/14/18   Wieters, Hallie C, PA-C  meloxicam (MOBIC) 7.5 MG tablet Take 1 tablet (7.5 mg total) by mouth daily. 03/04/19 05/03/19  Elson AreasSofia, Cassady Stanczak K, PA-C  methocarbamol (ROBAXIN) 500 MG tablet Take 1 tablet (500 mg total) by mouth 2 (two) times daily. 03/04/19   Elson AreasSofia, Dearius Hoffmann K, PA-C  triamcinolone ointment (KENALOG) 0.5 % Apply 1 application topically 2 (two) times daily.  12/29/18   Hall-Potvin, [place], PA-C    Family History Family History  Problem Relation Age of Onset  . Healthy Mother   . Healthy Father     Social History Social History   Tobacco Use  . Smoking status: Current Every Day Smoker    Packs/day: 1.00  . Smokeless tobacco: Never Used  Substance Use Topics  . Alcohol use: Yes    Comment: occ  . Drug use: No     Allergies   Patient has no known allergies.   Review of Systems Review of Systems  Musculoskeletal: Positive for arthralgias, joint swelling and myalgias. Negative for neck pain.  All other systems reviewed and are negative.    Physical Exam Triage Vital Signs ED Triage Vitals  Enc Vitals Group     BP 03/04/19 1114 (!) 162/98     Pulse Rate 03/04/19 1114 84     Resp 03/04/19 1114 16     Temp 03/04/19 1114 98.6 F (37 C)     Temp Source 03/04/19 1114 Oral     SpO2 03/04/19 1114 98 %     Weight --      Height --      Head Circumference --      Peak Flow --      Pain Score 03/04/19 1119 7  Pain Loc --      Pain Edu? --      Excl. in [place]? --    No data found.  Updated Vital Signs BP (!) 162/98 (BP Location: Left Arm)   Pulse 84   Temp 98.6 F (37 C) (Oral)   Resp 16   LMP  (Exact Date)   SpO2 98%   Visual Acuity Right Eye Distance:   Left Eye Distance:   Bilateral Distance:    Right Eye Near:   Left Eye Near:    Bilateral Near:     Physical Exam Vitals signs and nursing note reviewed.  Musculoskeletal:        General: Swelling and tenderness present.     Comments: Tender right shoulder,  Pain with movement,  Decrease abduction   Right knee swollen tender,  Small effusion   Skin:    General: Skin is warm.  Neurological:     General: No focal deficit present.  Psychiatric:        Mood and Affect: Mood normal.      UC Treatments / Results  Labs (all labs ordered are listed, but only abnormal results are displayed) Labs Reviewed - No data to display  EKG   Radiology Dg  Shoulder Right  Result Date: 03/04/2019 CLINICAL DATA:  Right shoulder pain for months EXAM: RIGHT SHOULDER - 2+ VIEW COMPARISON:  None. FINDINGS: There is no fracture or dislocation. There are moderate degenerative changes of the acromioclavicular joint. IMPRESSION: No acute osseous injury of the right shoulder. Electronically Signed   By: Kathreen Devoid   On: 03/04/2019 12:42   Dg Knee Complete 4 Views Right  Result Date: 03/04/2019 CLINICAL DATA:  RIGHT knee pain for 2 months, swelling for a week, unknown if injury EXAM: RIGHT KNEE - COMPLETE 4+ VIEW COMPARISON:  None FINDINGS: Osseous demineralization. Joint spaces preserved. Probable small joint effusion. No acute fracture, dislocation, or bone destruction. IMPRESSION: No acute osseous abnormalities. Probable small joint effusion. Electronically Signed   By: Lavonia Dana M.D.   On: 03/04/2019 12:46    Procedures Procedures (including critical care time)  Medications Ordered in UC Medications - No data to display  Initial Impression / Assessment and Plan / UC Course  I have reviewed the triage vital signs and the nursing notes.  Pertinent labs & imaging results that were available during my care of the patient were reviewed by me and considered in my medical decision making (see chart for details).     MDM  Pt advised to follow up with the Orthopaedist for evaluation  An After Visit Summary was printed and given to the patient.  Final Clinical Impressions(s) / UC Diagnoses   Final diagnoses:  Acute pain of right knee  Arthritis of right shoulder region     Discharge Instructions     Return if any problems.     ED Prescriptions    Medication Sig Dispense Auth. Provider   meloxicam (MOBIC) 7.5 MG tablet Take 1 tablet (7.5 mg total) by mouth daily. 30 tablet Moriah Loughry K, [place]   methocarbamol (ROBAXIN) 500 MG tablet Take 1 tablet (500 mg total) by mouth 2 (two) times daily. 20 tablet Fransico Meadow, [place]     Controlled  Substance Prescriptions [place] Controlled Substance Registry consulted? Not Applicable   Fransico Meadow, [place] 03/04/19 1321

## 2019-03-04 NOTE — Discharge Instructions (Signed)
Return if any problems.

## 2019-05-02 ENCOUNTER — Encounter (HOSPITAL_COMMUNITY): Payer: Self-pay

## 2019-05-02 ENCOUNTER — Other Ambulatory Visit: Payer: Self-pay

## 2019-05-02 ENCOUNTER — Ambulatory Visit (HOSPITAL_COMMUNITY)
Admission: EM | Admit: 2019-05-02 | Discharge: 2019-05-02 | Disposition: A | Discharge status: Home / Self Care | Payer: BC Managed Care – PPO | Attending: Family Medicine | Admitting: Family Medicine

## 2019-05-02 DIAGNOSIS — G8929 Other chronic pain: Secondary | ICD-10-CM

## 2019-05-02 DIAGNOSIS — M25511 Pain in right shoulder: Secondary | ICD-10-CM

## 2019-05-02 MED ORDER — PREDNISONE 10 MG (21) PO TBPK: ORAL_TABLET | Freq: Every day | ORAL | 0 refills | Status: DC

## 2019-05-02 NOTE — ED Triage Notes (Signed)
Patient presents to Urgent Care with complaints of chronic right shoulder pain. Patient reports she was given medicine last time that did not work and it is gone and her shoulder still hurts.

## 2019-05-04 ENCOUNTER — Encounter: Payer: Self-pay | Admitting: Sports Medicine

## 2019-05-04 ENCOUNTER — Ambulatory Visit (INDEPENDENT_AMBULATORY_CARE_PROVIDER_SITE_OTHER): Payer: BC Managed Care – PPO | Admitting: Sports Medicine

## 2019-05-04 ENCOUNTER — Other Ambulatory Visit: Payer: Self-pay

## 2019-05-04 ENCOUNTER — Ambulatory Visit: Payer: Self-pay

## 2019-05-04 VITALS — BP 128/70 | Ht 63.5 in | Wt 162.0 lb

## 2019-05-04 DIAGNOSIS — M25511 Pain in right shoulder: Secondary | ICD-10-CM | POA: Diagnosis not present

## 2019-05-04 DIAGNOSIS — M7551 Bursitis of right shoulder: Secondary | ICD-10-CM | POA: Diagnosis not present

## 2019-05-04 DIAGNOSIS — M75111 Incomplete rotator cuff tear or rupture of right shoulder, not specified as traumatic: Secondary | ICD-10-CM | POA: Diagnosis not present

## 2019-05-04 MED ORDER — METHYLPREDNISOLONE ACETATE 40 MG/ML IJ SUSP
40.0000 mg | Freq: Once | INTRAMUSCULAR | Status: AC
  Administered 2019-05-04: 16:00:00 40 mg via INTRA_ARTICULAR

## 2019-05-04 NOTE — Patient Instructions (Signed)
Your shoulder pain is caused by a small tear of your rotator cuff and bursitis -The injection that I gave you today may take several days to have an effect.  Do not be alarmed if it does not start working later [employer] -Physical therapy will work with you for rotator cuff strengthening exercises -You may take over-the-counter anti-inflammatories as needed for pain -I provided you with a work note for a 15 pound weight restriction -I will see back in 6 weeks.  At that time if you are having improvement of your rotator cuff pain then we can continue doing physical therapy.  If you are not having improvement of your pain we will talk about getting an MRI of your shoulder

## 2019-05-04 NOTE — Progress Notes (Addendum)
PCP: [name], No Pcp Per  Subjective:   HPI: [name] is a 63 y.o. female here for evaluation of right shoulder pain.  Pain is been present for the last several months.  She denies any injury or trauma that started the pain.  The pain is located on the lateral aspect of her shoulder and does not radiate down her arm.  [name] denies any numbness or tingling.  She has no bruising or swelling of her shoulder.  [name] notes her pain worsens with reaching her arm above her head or behind her back.  [name] also notes significant nighttime pain.  [name] has been taking over-the-counter anti-inflammatories which has not improved her pain.  [name] was seen at urgent care and given a prescription for Medrol Dosepak which she started yesterday.  [name] was instructed to follow-up here for further evaluation.  [name] notes the pain has a sharp stabbing quality to it.  Pain is currently a 5 out of 10.   Review of Systems: See HPI above.  Past Medical History:  Diagnosis Date  . Hypertension     Current Outpatient Medications on File Prior to Visit  Medication Sig Dispense Refill  . fluocinonide-emollient (LIDEX-E) 0.05 % cream Apply 1 application topically 2 (two) times daily. 30 g 0  . ibuprofen (ADVIL) 600 MG tablet Take 1 tablet (600 mg total) by mouth every 6 (six) hours as needed. 30 tablet 0  . methocarbamol (ROBAXIN) 500 MG tablet Take 1 tablet (500 mg total) by mouth 2 (two) times daily. 20 tablet 0  . predniSONE (STERAPRED UNI-PAK 21 TAB) 10 MG (21) TBPK tablet Take by mouth daily. Take as directed. 21 tablet 0  . triamcinolone ointment (KENALOG) 0.5 % Apply 1 application topically 2 (two) times daily. 30 g 0   No current facility-administered medications on file prior to visit.     Past Surgical History:  Procedure Laterality Date  . ABDOMINAL HYSTERECTOMY      No Known Allergies  Social History   Socioeconomic History  . Marital status: Widowed    Spouse name: Not on file   . Number of children: Not on file  . Years of education: Not on file  . Highest education level: Not on file  Occupational History  . Not on file  Social Needs  . Financial resource strain: Not on file  . Food insecurity    Worry: Not on file    Inability: Not on file  . Transportation needs    Medical: Not on file    Non-medical: Not on file  Tobacco Use  . Smoking status: Current Every Day Smoker    Packs/day: 1.00  . Smokeless tobacco: Never Used  Substance and Sexual Activity  . Alcohol use: Yes    Comment: occ  . Drug use: No  . Sexual activity: Not on file  Lifestyle  . Physical activity    Days per week: Not on file    Minutes per session: Not on file  . Stress: Not on file  Relationships  . Social Musician on phone: Not on file    Gets together: Not on file    Attends religious service: Not on file    Active member of club or organization: Not on file    Attends meetings of clubs or organizations: Not on file    Relationship status: Not on file  . Intimate partner violence    Fear of current or ex partner: Not on file  Emotionally abused: Not on file    Physically abused: Not on file    Forced sexual activity: Not on file  Other Topics Concern  . Not on file  Social History Narrative  . Not on file    Family History  Problem Relation Age of Onset  . Healthy Mother   . Healthy Father         Objective:  Physical Exam: BP 128/70   Ht 5' 3.5" (1.613 m)   Wt 162 lb (73.5 kg)   BMI 28.25 kg/m  Gen: NAD, comfortable in exam room Lungs: Breathing comfortably on room air Shoulder Exam Right -Inspection: No discoloration, no deformity -Palpation: No tenderness to palpation -ROM (active): Abduction: 170 degrees; Forward Flexion: 170 degrees; Internal Rotation: L5 -Strength: Abduction: 4/5; Forward Flexion: 5/5; Internal Rotation: 5/5; External Rotation: 5/5 -Special Tests: Hawkins: Positive; Neers: Positive; Jobs: Positive; O'briens:  Negative; Speeds: Negative; Yergasons: Negative; Cross arm: negative -Limb neurovascularly intact, no instability noted  Contralateral Shoulder -Inspection: No discoloration, no deformity -Palpation: No tenderness to palpation -ROM (active): Abduction: 180 degrees; Forward Flexion: 180 degrees; Internal Rotation: T10 -Strength: Abduction: 5/5; Forward Flexion: 5/5; Internal Rotation: 5/5; External Rotation: 5/5 -Limb neurovascularly intact, no instability noted  Cervical Exam:  -Full range of motion with flexion, extension, lateral rotation.  -Spurlings: Negative   Diagnostic ultrasound of the right shoulder Findings: -Biceps tendon intact is seen in both long and short axis.  There was a moderate amount of fluid noted within the biceps tendon sheath -Subscapularis had normal appearance without any tears noted -Infraspinatus had normal appearance without any tears noted -Teres minor had normal appearance without any tears noted -AC joint had evidence of arthritis with a moderate to large effusion noted -Supraspinatus had a partial-thickness articular sided tear without retraction. -There are cortical irregularities noted beneath the supraspinatus -Large amount of fluid noted within the subacromial bursa Impression: -Partial-thickness supraspinatus tear without retraction, AC joint effusion, biceps tenosynovitis, large subacromial bursitis   Assessment & Plan:  [name] is a 63 y.o. female here for evaluation of right shoulder pain  1.  Partial-thickness right supraspinatus tear with associated subacromial bursitis -Tear is likely degenerative -Consent was obtained for corticosteroid injection of [name]'s right shoulder.  A timeout was obtained prior to the injection.  40 mg of Depo-Medrol and 3 cc of lidocaine were injected into [name]'s subacromial bursa using ultrasound guidance and sterile technique.  [name] tolerated the injection well there were no complications -[name] was  referred to physical therapy for rotator cuff strengthening as well as range of motion exercises -[name] will follow-up in 6 weeks.  At that time if she is not having improvement in her rotator cuff pain we will get an MRI to further evaluate [name]'s rotator cuff tear.  If she is having improvement of rotator cuff pain we will continue working on physical therapy exercises  Addendum:  [name] seen in the office by fellow.  His history, exam, plan of care were precepted with me.  Karlton Lemon MD Kirt Boys

## 2019-05-04 NOTE — ED Provider Notes (Signed)
[place]   [id number] 05/02/19 Arrival Time: [place]:  1. Chronic right shoulder pain     No indication for further plain imaging at this time. Discussed. Possible impingement/rotator cuff etiology.  Meds ordered this encounter  Medications  . predniSONE (STERAPRED UNI-PAK 21 TAB) 10 MG (21) TBPK tablet    Sig: Take by mouth daily. Take as directed.    Dispense:  21 tablet    Refill:  0   Continue OTC ibuprofen 600mg  TID with food for the next 2-3 days. Encourage ROM of shoulder as she tolerates.  Recommend: Follow-up Information    Schedule an appointment as soon as possible for a visit  with [facility].   Contact information: [address] [place] [phone]           Reviewed expectations re: course of current medical issues. Questions answered. Outlined signs and symptoms indicating need for more acute intervention. Patient verbalized understanding. After Visit Summary given.  SUBJECTIVE: History from: patient. [name] is a 63 y.o. female who reports fairly persistent moderate pain of her right shoulder; described as aching and stiff; without radiation. Onset: gradual. First noted: "not sure; probably several months ago". Seen here 03/04/2019; notes reviewed; arthritic changes on x-rays. Injury/trama: no. Symptoms have waxed and waned but are worse overall since beginning. Aggravating factors: certain movements and trying to lift arm above shoulder level. Alleviating factors: have not been identified. Associated symptoms: none reported. Extremity sensation changes or weakness: none. Self treatment: occasional ibuprofen; questions only mild relief.  Past Surgical History:  Procedure Laterality Date  . ABDOMINAL HYSTERECTOMY       ROS: As per HPI. All other systems negative.    OBJECTIVE:  Vitals:   05/02/19 1253  BP: 134/74  Pulse: 81   Resp: 18  Temp: 97.8 F (36.6 C)  TempSrc: Temporal  SpO2: 100%    General appearance: alert; no distress HEENT: [place]; AT Neck: supple with FROM Resp: unlabored respirations Extremities: . RUE: warm with well perfused appearance; poorly localized mild tenderness over right shoulder - she questions slightly more anterior; without gross deformities; swelling: none; bruising: none; shoulder ROM: limited by pain, esp on abduction CV: brisk extremity capillary refill of RUE; 2+ radial pulse of RUE. Skin: warm and dry; no visible rashes Neurologic: gait normal; normal reflexes of RUE; normal sensation of RUE; normal strength of RUE Psychological: alert and cooperative; normal mood and affect   No Known Allergies  Past Medical History:  Diagnosis Date  . Hypertension    Social History   Socioeconomic History  . Marital status: Widowed    Spouse name: Not on file  . Number of children: Not on file  . Years of education: Not on file  . Highest education level: Not on file  Occupational History  . Not on file  Social Needs  . Financial resource strain: Not on file  . Food insecurity    Worry: Not on file    Inability: Not on file  . Transportation needs    Medical: Not on file    Non-medical: Not on file  Tobacco Use  . Smoking status: Current Every Day Smoker    Packs/day: 1.00  . Smokeless tobacco: Never Used  Substance and Sexual Activity  . Alcohol use: Yes    Comment: occ  . Drug use: No  . Sexual activity: Not on file  Lifestyle  . Physical activity  Days per week: Not on file    Minutes per session: Not on file  . Stress: Not on file  Relationships  . Social Musician on phone: Not on file    Gets together: Not on file    Attends religious service: Not on file    Active member of club or organization: Not on file    Attends meetings of clubs or organizations: Not on file    Relationship status: Not on file  Other Topics Concern  . Not on file   Social History Narrative  . Not on file   Family History  Problem Relation Age of Onset  . Healthy Mother   . Healthy Father    Past Surgical History:  Procedure Laterality Date  . ABDOMINAL HYSTERECTOMY        Katie Layman, MD 05/04/19 [phone]

## 2019-05-18 DIAGNOSIS — M25511 Pain in right shoulder: Secondary | ICD-10-CM | POA: Diagnosis not present

## 2019-05-18 DIAGNOSIS — M75111 Incomplete rotator cuff tear or rupture of right shoulder, not specified as traumatic: Secondary | ICD-10-CM | POA: Diagnosis not present

## 2019-05-18 DIAGNOSIS — M6281 Muscle weakness (generalized): Secondary | ICD-10-CM | POA: Diagnosis not present

## 2019-05-18 DIAGNOSIS — M25611 Stiffness of right shoulder, not elsewhere classified: Secondary | ICD-10-CM | POA: Diagnosis not present

## 2019-05-25 DIAGNOSIS — M6281 Muscle weakness (generalized): Secondary | ICD-10-CM | POA: Diagnosis not present

## 2019-05-25 DIAGNOSIS — M25611 Stiffness of right shoulder, not elsewhere classified: Secondary | ICD-10-CM | POA: Diagnosis not present

## 2019-05-25 DIAGNOSIS — M75111 Incomplete rotator cuff tear or rupture of right shoulder, not specified as traumatic: Secondary | ICD-10-CM | POA: Diagnosis not present

## 2019-05-25 DIAGNOSIS — M25511 Pain in right shoulder: Secondary | ICD-10-CM | POA: Diagnosis not present

## 2019-05-26 DIAGNOSIS — M25611 Stiffness of right shoulder, not elsewhere classified: Secondary | ICD-10-CM | POA: Diagnosis not present

## 2019-05-26 DIAGNOSIS — M75111 Incomplete rotator cuff tear or rupture of right shoulder, not specified as traumatic: Secondary | ICD-10-CM | POA: Diagnosis not present

## 2019-05-26 DIAGNOSIS — M6281 Muscle weakness (generalized): Secondary | ICD-10-CM | POA: Diagnosis not present

## 2019-05-26 DIAGNOSIS — M25511 Pain in right shoulder: Secondary | ICD-10-CM | POA: Diagnosis not present

## 2019-06-15 ENCOUNTER — Other Ambulatory Visit: Payer: Self-pay

## 2019-06-15 ENCOUNTER — Encounter: Payer: Self-pay | Admitting: Sports Medicine

## 2019-06-15 ENCOUNTER — Ambulatory Visit (INDEPENDENT_AMBULATORY_CARE_PROVIDER_SITE_OTHER): Payer: BC Managed Care – PPO | Admitting: Sports Medicine

## 2019-06-15 VITALS — BP 142/92

## 2019-06-15 DIAGNOSIS — M7581 Other shoulder lesions, right shoulder: Secondary | ICD-10-CM

## 2019-06-15 MED ORDER — MELOXICAM 15 MG PO TABS: 15.0000 mg | ORAL_TABLET | Freq: Every day | ORAL | 1 refills | Status: DC | PRN

## 2019-06-15 NOTE — Progress Notes (Addendum)
PCP: Patient, No Pcp Per  Subjective:   HPI: Patient is a 63 y.o. female here for follow-up of right shoulder pain.  Patient's pain is been present for the last several months.  She was last seen at the end of October for her shoulder pain.  She had a diagnostic ultrasound done showing a small partial-thickness supraspinatus tear as well as subacromial bursitis.  She had a corticosteroid injection into her right subacromial space at her last visit and was referred to physical therapy.  Patient notes since her last visit she has had considerable improvement in her strength as well as range of motion.  She still notes some ongoing shoulder pain with forward flexion and abduction of her shoulder but this is also starting to improve.  She is not currently taking any medications for her pain.  Pain is located on lateral aspect of her shoulder and does not radiate.  She has no numbness or tingling in her arm.  She has no bruising or swelling.  Patient's current pain is a 2 out of 10.   Review of Systems: See HPI above.  Past Medical History:  Diagnosis Date  . Hypertension     No current outpatient medications on file prior to visit.   No current facility-administered medications on file prior to visit.     Past Surgical History:  Procedure Laterality Date  . ABDOMINAL HYSTERECTOMY      No Known Allergies  Social History   Socioeconomic History  . Marital status: Widowed    Spouse name: Not on file  . Number of children: Not on file  . Years of education: Not on file  . Highest education level: Not on file  Occupational History  . Not on file  Social Needs  . Financial resource strain: Not on file  . Food insecurity    Worry: Not on file    Inability: Not on file  . Transportation needs    Medical: Not on file    Non-medical: Not on file  Tobacco Use  . Smoking status: Current Every Day Smoker    Packs/day: 1.00  . Smokeless tobacco: Never Used  Substance and Sexual  Activity  . Alcohol use: Yes    Comment: occ  . Drug use: No  . Sexual activity: Not on file  Lifestyle  . Physical activity    Days per week: Not on file    Minutes per session: Not on file  . Stress: Not on file  Relationships  . Social Herbalist on phone: Not on file    Gets together: Not on file    Attends religious service: Not on file    Active member of club or organization: Not on file    Attends meetings of clubs or organizations: Not on file    Relationship status: Not on file  . Intimate partner violence    Fear of current or ex partner: Not on file    Emotionally abused: Not on file    Physically abused: Not on file    Forced sexual activity: Not on file  Other Topics Concern  . Not on file  Social History Narrative  . Not on file    Family History  Problem Relation Age of Onset  . Healthy Mother   . Healthy Father         Objective:  Physical Exam: BP (!) 142/92  Gen: NAD, comfortable in exam room Lungs: Breathing comfortably on room air Shoulder  Exam Right -Inspection: No discoloration, no deformity -Palpation: No tenderness to palpation -ROM (active): Abduction: 180 degrees; Forward Flexion: 180 degrees; Internal Rotation: T10 -Strength: Abduction: 5/5; Forward Flexion: 5/5; Internal Rotation: 5/5; External Rotation: 5/5 -Special Tests: Hawkins: Positive; Neers: Negative; Jobs: Positive; O'briens: Negative; Speeds: Negative; Yergasons: Negative; Cross arm: negative -Limb neurovascularly intact, no instability noted    Assessment & Plan:  Patient is a 63 y.o. female here for follow-up of right shoulder pain  1.  Right rotator cuff tendinitis with small partial-thickness tear of the supraspinatus -Patient with interval improvement since her last visit at the end of October.  Her range of motion and strength are good at today's visit however she still has some ongoing issues with pain in her shoulder -A prescription for meloxicam was sent  to the pharmacy for her.  She was advised not to take this with other NSAIDs but may take this with Tylenol -Patient still has several visits left with formal physical therapy.  I stressed that she should continue to work on the exercises between the days that she goes to physical therapy -Patient to continue working on exercises for the next 4 weeks.  If her pain does not improve over this time then we will consider getting an MRI of her shoulder to better evaluate the tear seen on ultrasound.  Patient was told that if her pain worsens over the next month or if she plateaus and no longer is having clinical improvement she can call our clinic prior to her next visit and I can arrange to have the MRI done before her visit  Addendum:  Patient seen in the office by fellow.  His history, exam, plan of care were precepted with me.  Norton Blizzard MD Marrianne Mood

## 2019-06-15 NOTE — Patient Instructions (Signed)
Your shoulder pain is caused by tendinitis of the rotator cuff as well as a small partial-thickness tear of the rotator cuff -Your strength and movement of your shoulder have improved significantly since I last saw you -Continue working with the physical therapist on your strength and motion exercises.  Make sure to do these exercises at home when you are not at therapy as well -I have sent a prescription for medication called meloxicam to the pharmacy for you.  This is a strong anti-inflammatory which should help with some of the pain you have been getting.  Do not take it with other NSAIDs such as ibuprofen, naproxen, Aleve -I will see you back in 4 weeks.  At that time if you are still having pain we will get an MRI of your shoulder to better evaluate the rotator cuff tear that was seen on ultrasound.  If you feel like your symptoms are worsening prior to this visit or your pain is not well controlled please call our office and I can arrange to get the MRI done sooner if desired.

## 2019-06-21 DIAGNOSIS — M25611 Stiffness of right shoulder, not elsewhere classified: Secondary | ICD-10-CM | POA: Diagnosis not present

## 2019-06-21 DIAGNOSIS — M75111 Incomplete rotator cuff tear or rupture of right shoulder, not specified as traumatic: Secondary | ICD-10-CM | POA: Diagnosis not present

## 2019-06-21 DIAGNOSIS — M25511 Pain in right shoulder: Secondary | ICD-10-CM | POA: Diagnosis not present

## 2019-06-21 DIAGNOSIS — M6281 Muscle weakness (generalized): Secondary | ICD-10-CM | POA: Diagnosis not present

## 2019-06-22 DIAGNOSIS — M75111 Incomplete rotator cuff tear or rupture of right shoulder, not specified as traumatic: Secondary | ICD-10-CM | POA: Diagnosis not present

## 2019-06-22 DIAGNOSIS — M6281 Muscle weakness (generalized): Secondary | ICD-10-CM | POA: Diagnosis not present

## 2019-06-22 DIAGNOSIS — M25611 Stiffness of right shoulder, not elsewhere classified: Secondary | ICD-10-CM | POA: Diagnosis not present

## 2019-06-22 DIAGNOSIS — M25511 Pain in right shoulder: Secondary | ICD-10-CM | POA: Diagnosis not present

## 2019-07-13 ENCOUNTER — Ambulatory Visit: Payer: BC Managed Care – PPO | Admitting: Sports Medicine

## 2019-12-02 ENCOUNTER — Ambulatory Visit (INDEPENDENT_AMBULATORY_CARE_PROVIDER_SITE_OTHER): Payer: BC Managed Care – PPO

## 2019-12-02 ENCOUNTER — Encounter: Payer: Self-pay | Admitting: Podiatry

## 2019-12-02 ENCOUNTER — Other Ambulatory Visit: Payer: Self-pay

## 2019-12-02 ENCOUNTER — Ambulatory Visit (INDEPENDENT_AMBULATORY_CARE_PROVIDER_SITE_OTHER): Payer: BC Managed Care – PPO | Admitting: Podiatry

## 2019-12-02 DIAGNOSIS — M2031 Hallux varus (acquired), right foot: Secondary | ICD-10-CM

## 2019-12-02 DIAGNOSIS — Z01818 Encounter for other preprocedural examination: Secondary | ICD-10-CM

## 2019-12-02 DIAGNOSIS — M2011 Hallux valgus (acquired), right foot: Secondary | ICD-10-CM | POA: Diagnosis not present

## 2019-12-02 NOTE — Patient Instructions (Signed)
Pre-Operative Instructions  Congratulations, you have decided to take an important step towards improving your quality of life.  You can be assured that the doctors and staff at [facility] will be with you every step of the way.  Here are some important things you should know:  1. Plan to be at the surgery center/hospital at least 1 (one) hour prior to your scheduled time, unless otherwise directed by the surgical center/hospital staff.  You must have a responsible adult accompany you, remain during the surgery and drive you home.  Make sure you have directions to the surgical center/hospital to ensure you arrive on time. 2. If you are having surgery at [facility] or [facility], you will need a copy of your medical history and physical form from your family physician within one month prior to the date of surgery. We will give you a form for your primary physician to complete.  3. We make every effort to accommodate the date you request for surgery.  However, there are times where surgery dates or times have to be moved.  We will contact you as soon as possible if a change in schedule is required.   4. No aspirin/ibuprofen for one week before surgery.  If you are on aspirin, any non-steroidal anti-inflammatory medications (Mobic, Aleve, Ibuprofen) should not be taken seven (7) days prior to your surgery.  You make take Tylenol for pain prior to surgery.  5. Medications - If you are taking daily heart and blood pressure medications, seizure, reflux, allergy, asthma, anxiety, pain or diabetes medications, make sure you notify the surgery center/hospital before the day of surgery so they can tell you which medications you should take or avoid the day of surgery. 6. No food or drink after midnight the night before surgery unless directed otherwise by surgical center/hospital staff. 7. No alcoholic beverages 24-hours prior to surgery.  No smoking 24-hours prior or 24-hours after  surgery. 8. Wear loose pants or shorts. They should be loose enough to fit over bandages, boots, and casts. 9. Don't wear slip-on shoes. Sneakers are preferred. 10. Bring your boot with you to the surgery center/hospital.  Also bring crutches or a walker if your physician has prescribed it for you.  If you do not have this equipment, it will be provided for you after surgery. 11. If you have not been contacted by the surgery center/hospital by the day before your surgery, call to confirm the date and time of your surgery. 12. Leave-time from work may vary depending on the type of surgery you have.  Appropriate arrangements should be made prior to surgery with your employer. 13. Prescriptions will be provided immediately following surgery by your doctor.  Fill these as soon as possible after surgery and take the medication as directed. Pain medications will not be refilled on weekends and must be approved by the doctor. 14. Remove nail polish on the operative foot and avoid getting pedicures prior to surgery. 15. Wash the night before surgery.  The night before surgery wash the foot and leg well with water and the antibacterial soap provided. Be sure to pay special attention to beneath the toenails and in between the toes.  Wash for at least three (3) minutes. Rinse thoroughly with water and dry well with a towel.  Perform this wash unless told not to do so by your physician.  Enclosed: 1 Ice pack (please put in freezer the night before surgery)   1 Hibiclens skin cleaner     Pre-op instructions  If you have any questions regarding the instructions, please do not hesitate to call our office.  [place]: [address], [place], [place] [address] -- [phone]  [place]: [address] [place], [place] [address] -- [phone]  [place]: [address], [place], [place] [address] -- [phone]   Website: [email or web address] 

## 2019-12-08 ENCOUNTER — Encounter: Payer: Self-pay | Admitting: Podiatry

## 2019-12-08 NOTE — Progress Notes (Signed)
Subjective:  Patient ID: [name], female    DOB: [birth year],  MRN: [id number]  Chief Complaint  Patient presents with  . Foot Pain    pt is here for foot pain, pt states that the right foot is hurting and is looking to get it looked at.    64 y.o. female presents with the above complaint.  Patient presents with a complaint of right bunion deformity as well as right fifth digit hammertoe contracture.  Patient states that there has been a lot of pain associated with it.  Pain has been going on for quite some time.  Patient has tried various conservative therapy including taping shoe gear modification offloading orthotics but has failed them all.  Patient at this time would like to discuss surgical interventions.  She states is very painful especially when ambulating because the bunion constantly rubs on it as well as the fifth digit corn.  She has tried debriding them but has not helped.  Pain scale 6 out of 10.  Is dull achy in nature.  She would like to discuss surgical options.  Review of Systems: Negative except as noted in the HPI. Denies N/V/F/Ch.  Past Medical History:  Diagnosis Date  . Hypertension     Current Outpatient Medications:  .  meloxicam (MOBIC) 15 MG tablet, Take 1 tablet (15 mg total) by mouth daily as needed for pain., Disp: 30 tablet, Rfl: 1  Social History   Tobacco Use  Smoking Status Current Every Day Smoker  . Packs/day: 1.00  Smokeless Tobacco Never Used    No Known Allergies Objective:  There were no vitals filed for this visit. There is no height or weight on file to calculate BMI. Constitutional Well developed. Well nourished.  Vascular Dorsalis pedis pulses palpable bilaterally. Posterior tibial pulses palpable bilaterally. Capillary refill normal to all digits.  No cyanosis or clubbing noted. Pedal hair growth normal.  Neurologic Normal speech. Oriented to person, place, and time. Epicritic sensation to light touch grossly present  bilaterally.  Dermatologic Nails well groomed and normal in appearance. No open wounds. No skin lesions.  Orthopedic: Normal joint ROM without pain or crepitus bilaterally. Hallux abductovalgus deformity present right foot.  No intra-articular pain noted this is a tract bound not a tracking deformity.  Pain at the palpation of the medial eminence. Left 1st MPJ diminished range of motion. Left 1st TMT without gross hypermobility. Right 1st MPJ diminished range of motion  Right 1st TMT without gross hypermobility. Lesser digital contractures present - Fifth digit hammertoe contracture semireducible with lateral hyperkeratotic lesion right.  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  There is hypertrophy of the medial eminence noted.  Sesamoid position is 6 out of 7.  There is a valgus deformity of the hallux noted.  There is an increase in intermetatarsal angle moderate in nature.  There is increase in hallux valgus angle.  Subchondral sclerosis present of the base of the proximal phalanx there is adductovarus rotation of the fifth digit with hammertoe contracture noted.  There is plantar and posterior heel spurring noted.  There is decreasing calcaneal inclination angle increase in talar declination angle anterior break in the cyma line.  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   Assessment:   1. Bunion    Plan:  Patient was evaluated and  treated and all questions answered.  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 -Patient has failed all conservative therapy and wishes to proceed with surgical intervention. All risks, benefits, and alternatives discussed with patient. No guarantees given. Consent reviewed and signed by  patient. Post-op course explained at length. -Planned procedures: Right chevron osteotomy with screw fixation with a possible Akin osteotomy and right fifth digit hammertoe contracture with derotational arthroplasty -Risk factors: None -Informed surgical risk consent was reviewed and read aloud to the patient.  I reviewed the films.  I have discussed my findings with the patient in great detail.  I have discussed all risks including but not limited to infection, stiffness, scarring, limp, disability, deformity, damage to blood vessels and nerves, numbness, poor healing, need for braces, arthritis, chronic pain, amputation, death.  All benefits and realistic expectations discussed in great detail.  I have made no promises as to the outcome.  I have provided realistic expectations.  I have offered the patient a 2nd opinion, which they have declined and assured me they preferred to proceed despite the risks -Cam boot was dispensed -A total of 46 minutes was spent in direct patient care as well as pre and post patient encounter activities.  This includes documentation as well as reviewing patient chart for labs, imaging, past medical, surgical, social, and family history as documented in the EMR.  I have reviewed medication allergies as documented in EMR.  I discussed the etiology of condition and treatment options from conservative to surgical care.  All risks and benefit of the treatment course was discussed in detail.  All questions were answered and return appointment was discussed.  Since the visit completed in an ambulatory/outpatient setting, the patient and/or parent/guardian has been advised to contact the providers office for worsening condition and seek medical treatment and/or call 911 if the patient deems either is necessary.   No follow-ups on file.

## 2019-12-20 ENCOUNTER — Telehealth: Payer: Self-pay

## 2019-12-20 NOTE — Telephone Encounter (Addendum)
DOS 06/30/201  [place] BUNIONECTOMY RT - 49201 CORRECTION BUNION BY PHALANX OSTEOTOMY RT - 28298 HAMMERTOE 5TH RT - 28285  [employer] EFFECTIVE DATE - 07/07/2018  PLAN DEDUCTIBLE - $3500.00 W/ $2861.00 REMAINING OUT OF POCKET - $5500.00 W/ $4861.00 REMAINING COPAY $0.00 COINSURANCE - 20%  PER AIM WEBSITE NO ORDER NUMBER REQUIRED FOR SURGERY

## 2020-01-02 ENCOUNTER — Encounter: Payer: Self-pay | Admitting: Podiatry

## 2020-01-02 DIAGNOSIS — M21611 Bunion of right foot: Secondary | ICD-10-CM | POA: Diagnosis not present

## 2020-01-02 MED ORDER — OXYCODONE-ACETAMINOPHEN 10-325 MG PO TABS: 1.0000 | ORAL_TABLET | Freq: Four times a day (QID) | ORAL | 0 refills | Status: AC | PRN

## 2020-01-02 MED ORDER — OXYCODONE-ACETAMINOPHEN 10-325 MG PO TABS: 1.0000 | ORAL_TABLET | Freq: Four times a day (QID) | ORAL | 0 refills | Status: DC | PRN

## 2020-01-02 MED ORDER — IBUPROFEN 800 MG PO TABS: 800.0000 mg | ORAL_TABLET | Freq: Four times a day (QID) | ORAL | 1 refills | Status: DC | PRN

## 2020-01-02 NOTE — Addendum Note (Signed)
Addended by: Nicholes Rough on: 01/02/2020 08:41 AM   Modules accepted: Orders

## 2020-01-02 NOTE — Addendum Note (Signed)
Addended by: Nicholes Rough on: 01/02/2020 08:13 AM   Modules accepted: Orders

## 2020-01-04 ENCOUNTER — Encounter: Payer: Self-pay | Admitting: Podiatry

## 2020-01-04 DIAGNOSIS — M21611 Bunion of right foot: Secondary | ICD-10-CM | POA: Diagnosis not present

## 2020-01-04 DIAGNOSIS — M2011 Hallux valgus (acquired), right foot: Secondary | ICD-10-CM | POA: Diagnosis not present

## 2020-01-04 DIAGNOSIS — M25571 Pain in right ankle and joints of right foot: Secondary | ICD-10-CM | POA: Diagnosis not present

## 2020-01-04 DIAGNOSIS — M2041 Other hammer toe(s) (acquired), right foot: Secondary | ICD-10-CM | POA: Diagnosis not present

## 2020-01-04 DIAGNOSIS — Z01818 Encounter for other preprocedural examination: Secondary | ICD-10-CM | POA: Diagnosis not present

## 2020-01-06 ENCOUNTER — Other Ambulatory Visit: Payer: Self-pay

## 2020-01-06 ENCOUNTER — Ambulatory Visit (INDEPENDENT_AMBULATORY_CARE_PROVIDER_SITE_OTHER): Payer: BC Managed Care – PPO

## 2020-01-06 ENCOUNTER — Encounter: Payer: Self-pay | Admitting: Podiatry

## 2020-01-06 ENCOUNTER — Ambulatory Visit (INDEPENDENT_AMBULATORY_CARE_PROVIDER_SITE_OTHER): Payer: BC Managed Care – PPO | Admitting: Podiatry

## 2020-01-06 DIAGNOSIS — M2031 Hallux varus (acquired), right foot: Secondary | ICD-10-CM

## 2020-01-06 DIAGNOSIS — Z9889 Other specified postprocedural states: Secondary | ICD-10-CM

## 2020-01-06 DIAGNOSIS — M2011 Hallux valgus (acquired), right foot: Secondary | ICD-10-CM

## 2020-01-06 NOTE — Progress Notes (Signed)
  Subjective:  Patient ID: [name], female    DOB: 09-05-1955,  MRN: [id number]  No chief complaint on file.   DOS: 01/04/2020 Procedure: Correction of right bunion with fifth digit hammertoe correction  64 y.o. female returns for post-op check.  Patient is doing well.  Patient states that it was a little bit of bleeding associated with it.  Patient would like to just get it evaluated.  She has been ambulating with a cam boot.  She has not been able to take it easy as she does not have any help at home.  Is mildly painful to walk on.  Patient been taking pain medication.  Review of Systems: Negative except as noted in the HPI. Denies N/V/F/Ch.  Past Medical History:  Diagnosis Date  . Hypertension     Current Outpatient Medications:  .  ibuprofen (ADVIL) 800 MG tablet, Take 1 tablet (800 mg total) by mouth every 6 (six) hours as needed., Disp: 60 tablet, Rfl: 1 .  meloxicam (MOBIC) 15 MG tablet, Take 1 tablet (15 mg total) by mouth daily as needed for pain., Disp: 30 tablet, Rfl: 1 .  oxyCODONE-acetaminophen (PERCOCET) 10-325 MG tablet, Take 1 tablet by mouth every 6 (six) hours as needed for up to 8 days for pain., Disp: 30 tablet, Rfl: 0  Social History   Tobacco Use  Smoking Status Current Every Day Smoker  . Packs/day: 1.00  Smokeless Tobacco Never Used    No Known Allergies Objective:  There were no vitals filed for this visit. There is no height or weight on file to calculate BMI. Constitutional Well developed. Well nourished.  Vascular Foot warm and well perfused. Capillary refill normal to all digits.   Neurologic Normal speech. Oriented to person, place, and time. Epicritic sensation to light touch grossly present bilaterally.  Dermatologic Skin healing well without signs of infection. Skin edges well coapted without signs of infection.  Orthopedic: Tenderness to palpation noted about the surgical site.   Radiographs: 3 views of skeletally mature adult  right foot: Hardware is intact.  Good correction and alignment noted.  No signs of capital fragment elevation or fractures noted. Assessment:   1. Hallux abducto valgus, right    Plan:  Patient was evaluated and treated and all questions answered.  S/p foot surgery right -Progressing as expected post-operatively. -XR: See above -WB Status: Weightbearing as tolerated in cam boot -Sutures: Intact.  No clinical signs of dehiscence noted.  Mild maceration present. -Medications: None -Foot redressed.  Return in about 2 weeks (around 01/20/2020) for POV-no xray.

## 2020-01-11 ENCOUNTER — Encounter: Payer: BC Managed Care – PPO | Admitting: Podiatry

## 2020-01-12 ENCOUNTER — Other Ambulatory Visit: Payer: Self-pay | Admitting: Podiatry

## 2020-01-12 DIAGNOSIS — M2031 Hallux varus (acquired), right foot: Secondary | ICD-10-CM

## 2020-01-18 ENCOUNTER — Other Ambulatory Visit: Payer: Self-pay

## 2020-01-18 ENCOUNTER — Ambulatory Visit (INDEPENDENT_AMBULATORY_CARE_PROVIDER_SITE_OTHER): Payer: BC Managed Care – PPO | Admitting: Podiatry

## 2020-01-18 DIAGNOSIS — M2011 Hallux valgus (acquired), right foot: Secondary | ICD-10-CM

## 2020-01-18 DIAGNOSIS — Z9889 Other specified postprocedural states: Secondary | ICD-10-CM

## 2020-01-18 DIAGNOSIS — M2031 Hallux varus (acquired), right foot: Secondary | ICD-10-CM

## 2020-01-19 ENCOUNTER — Encounter: Payer: Self-pay | Admitting: Podiatry

## 2020-01-19 NOTE — Progress Notes (Signed)
  Subjective:  Patient ID: [name], female    DOB: 1956-03-29,  MRN: [id number]  Chief Complaint  Patient presents with  . Routine Post Op    POV#2 DOS 6.30.2021 RT CORRECTION OF BUNION AND FIFTH DIGIT HAMMERTOE. Pt states some itching and swelling. Denies fever/chills/nausea/vomiting.    DOS: 01/04/2020 Procedure: Correction of right bunion with fifth digit hammertoe correction  64 y.o. female returns for post-op check.  Patient is doing well.  Patient states that she has been using the cam boot to ambulate.  She is happy with her correction.  She denies any other acute complaints.  Pain is well controlled on pain medication.  Review of Systems: Negative except as noted in the HPI. Denies N/V/F/Ch.  Past Medical History:  Diagnosis Date  . Hypertension     Current Outpatient Medications:  .  ibuprofen (ADVIL) 800 MG tablet, Take 1 tablet (800 mg total) by mouth every 6 (six) hours as needed., Disp: 60 tablet, Rfl: 1 .  meloxicam (MOBIC) 15 MG tablet, Take 1 tablet (15 mg total) by mouth daily as needed for pain., Disp: 30 tablet, Rfl: 1  Social History   Tobacco Use  Smoking Status Current Every Day Smoker  . Packs/day: 1.00  Smokeless Tobacco Never Used    No Known Allergies Objective:  There were no vitals filed for this visit. There is no height or weight on file to calculate BMI. Constitutional Well developed. Well nourished.  Vascular Foot warm and well perfused. Capillary refill normal to all digits.   Neurologic Normal speech. Oriented to person, place, and time. Epicritic sensation to light touch grossly present bilaterally.  Dermatologic Skin healing well without signs of infection. Skin edges well coapted without signs of infection.  Orthopedic: Tenderness to palpation noted about the surgical site.   Radiographs: 3 views of skeletally mature adult right foot: Hardware is intact.  Good correction and alignment noted.  No signs of capital fragment  elevation or fractures noted. Assessment:   1. Hallux abducto valgus, right   2. Hallux hammertoe, right   3. Status post foot surgery    Plan:  Patient was evaluated and treated and all questions answered.  S/p foot surgery right -Progressing as expected post-operatively. -XR: See above -WB Status: Weightbearing as tolerated in cam boot -Sutures: Sutures were clipped.  No clinical signs of dehiscence noted.. -Medications: None -Foot redressed.  No follow-ups on file.

## 2020-02-01 ENCOUNTER — Ambulatory Visit (INDEPENDENT_AMBULATORY_CARE_PROVIDER_SITE_OTHER): Payer: Self-pay

## 2020-02-01 ENCOUNTER — Ambulatory Visit (INDEPENDENT_AMBULATORY_CARE_PROVIDER_SITE_OTHER): Payer: BC Managed Care – PPO | Admitting: Podiatry

## 2020-02-01 ENCOUNTER — Other Ambulatory Visit: Payer: Self-pay

## 2020-02-01 DIAGNOSIS — M2031 Hallux varus (acquired), right foot: Secondary | ICD-10-CM

## 2020-02-01 DIAGNOSIS — M2011 Hallux valgus (acquired), right foot: Secondary | ICD-10-CM

## 2020-02-01 DIAGNOSIS — Z9889 Other specified postprocedural states: Secondary | ICD-10-CM

## 2020-02-01 NOTE — Progress Notes (Signed)
Dos 01/02/20 Right [place] Bunionectomy with correction Bunion by phalanx osteotomy

## 2020-02-02 ENCOUNTER — Encounter: Payer: Self-pay | Admitting: Podiatry

## 2020-02-02 NOTE — Progress Notes (Signed)
°  Subjective:  Patient ID: [name], female    DOB: 09-16-1955,  MRN: [id number]  Chief Complaint  Patient presents with   Routine Post Op    POV #3 DOS 01/04/20 RT CORRECTION OF BUNION & FIFTH DIGIT HAMMERTOE    DOS: 01/04/2020 Procedure: Correction of right bunion with fifth digit hammertoe correction  64 y.o. female returns for post-op check.  Patient is doing well.  Patient states that she has been using the cam boot to ambulate.  She is happy with her correction.  She denies any other acute complaints.  Pain is well controlled on pain medication.  Review of Systems: Negative except as noted in the HPI. Denies N/V/F/Ch.  Past Medical History:  Diagnosis Date   Hypertension     Current Outpatient Medications:    ibuprofen (ADVIL) 800 MG tablet, Take 1 tablet (800 mg total) by mouth every 6 (six) hours as needed., Disp: 60 tablet, Rfl: 1   meloxicam (MOBIC) 15 MG tablet, Take 1 tablet (15 mg total) by mouth daily as needed for pain., Disp: 30 tablet, Rfl: 1  Social History   Tobacco Use  Smoking Status Current Every Day Smoker   Packs/day: 1.00  Smokeless Tobacco Never Used    No Known Allergies Objective:  There were no vitals filed for this visit. There is no height or weight on file to calculate BMI. Constitutional Well developed. Well nourished.  Vascular Foot warm and well perfused. Capillary refill normal to all digits.   Neurologic Normal speech. Oriented to person, place, and time. Epicritic sensation to light touch grossly present bilaterally.  Dermatologic  skin very epithelialized.  No clinical signs of infection noted.  Orthopedic:  Mild tenderness to palpation noted about the surgical site.   Radiographs: 3 views of skeletally mature adult right foot: Hardware is intact.  Good correction and alignment noted.  No signs of capital fragment elevation or fractures noted. Assessment:   1. Hallux abducto valgus, right   2. Hallux hammertoe,  right   3. Status post foot surgery    Plan:  Patient was evaluated and treated and all questions answered.  S/p foot surgery right -Progressing as expected post-operatively. -XR: See above -WB Status: Transition to regular sneaker. -Sutures: None -Medications: None -Foot redressed.  No follow-ups on file.

## 2020-02-22 ENCOUNTER — Ambulatory Visit (INDEPENDENT_AMBULATORY_CARE_PROVIDER_SITE_OTHER): Payer: Self-pay

## 2020-02-22 ENCOUNTER — Other Ambulatory Visit: Payer: Self-pay

## 2020-02-22 ENCOUNTER — Encounter: Payer: Self-pay | Admitting: Podiatry

## 2020-02-22 ENCOUNTER — Other Ambulatory Visit: Payer: Self-pay | Admitting: Podiatry

## 2020-02-22 ENCOUNTER — Ambulatory Visit (INDEPENDENT_AMBULATORY_CARE_PROVIDER_SITE_OTHER): Payer: Self-pay | Admitting: Podiatry

## 2020-02-22 DIAGNOSIS — M2011 Hallux valgus (acquired), right foot: Secondary | ICD-10-CM

## 2020-02-22 DIAGNOSIS — Z9889 Other specified postprocedural states: Secondary | ICD-10-CM

## 2020-02-22 DIAGNOSIS — M79672 Pain in left foot: Secondary | ICD-10-CM

## 2020-02-22 DIAGNOSIS — M2031 Hallux varus (acquired), right foot: Secondary | ICD-10-CM

## 2020-02-23 ENCOUNTER — Encounter: Payer: Self-pay | Admitting: Podiatry

## 2020-02-23 NOTE — Progress Notes (Signed)
  Subjective:  Patient ID: [name], female    DOB: [birth year],  MRN: [id number]  Chief Complaint  Patient presents with  . Post-op Problem    -pt [place]/o increase swelling at surgery area and dorsal ankle x last mo; pain is okay - swelling comes and goes tx: icing    DOS: 01/04/2020 Procedure: Correction of right bunion with fifth digit hammertoe correction  64 y.o. female returns for post-op check.  Patient is doing well. She has been able to ambulate with her regular sneakers. She is happy with her correction.  She denies any other acute complaints.  Pain is well controlled on pain medication.  Review of Systems: Negative except as noted in the HPI. Denies N/V/F/Ch.  Past Medical History:  Diagnosis Date  . Hypertension     Current Outpatient Medications:  .  ibuprofen (ADVIL) 800 MG tablet, Take 1 tablet (800 mg total) by mouth every 6 (six) hours as needed., Disp: 60 tablet, Rfl: 1 .  meloxicam (MOBIC) 15 MG tablet, Take 1 tablet (15 mg total) by mouth daily as needed for pain., Disp: 30 tablet, Rfl: 1  Social History   Tobacco Use  Smoking Status Current Every Day Smoker  . Packs/day: 1.00  Smokeless Tobacco Never Used    No Known Allergies Objective:  There were no vitals filed for this visit. There is no height or weight on file to calculate BMI. Constitutional Well developed. Well nourished.  Vascular Foot warm and well perfused. Capillary refill normal to all digits.   Neurologic Normal speech. Oriented to person, place, and time. Epicritic sensation to light touch grossly present bilaterally.  Dermatologic  skin very epithelialized.  No clinical signs of infection noted. Good range of motion noted at the metatarsophalangeal joint. No crepitus noted. No pain on palpation to the incision  Orthopedic:  No tenderness to palpation noted about the surgical site.   Radiographs: 3 views of skeletally mature adult right foot: Hardware is intact.  Good correction  and alignment noted.  No signs of capital fragment elevation or fractures noted. Assessment:   1. Hallux abducto valgus, right   2. Hallux hammertoe, right   3. Status post foot surgery    Plan:  Patient was evaluated and treated and all questions answered.  S/p foot surgery right -Progressing as expected post-operatively. -XR: See above -WB Status: Transition to regular sneaker. -Sutures: None -Medications: None -Patient can return to work. Given the clinically she has improved considerably. I will plan on discharging her from my care. I have asked her to come back and see me if any foot and ankle issues arise in the future. Patient states understanding.  No follow-ups on file.

## 2020-03-08 ENCOUNTER — Ambulatory Visit (HOSPITAL_COMMUNITY)
Admission: EM | Admit: 2020-03-08 | Discharge: 2020-03-08 | Disposition: A | Discharge status: Home / Self Care | Payer: BC Managed Care – PPO | Attending: Physician Assistant | Admitting: Physician Assistant

## 2020-03-08 ENCOUNTER — Encounter (HOSPITAL_COMMUNITY): Payer: Self-pay

## 2020-03-08 ENCOUNTER — Other Ambulatory Visit: Payer: Self-pay

## 2020-03-08 DIAGNOSIS — L309 Dermatitis, unspecified: Secondary | ICD-10-CM

## 2020-03-08 MED ORDER — EUCERIN EX CREA
TOPICAL_CREAM | CUTANEOUS | 0 refills | Status: DC | PRN
Start: 2020-03-08 — End: 2024-05-05

## 2020-03-08 MED ORDER — FLUOCINONIDE-E 0.05 % EX CREA
1.0000 | TOPICAL_CREAM | Freq: Two times a day (BID) | CUTANEOUS | 0 refills | Status: DC
Start: 2020-03-08 — End: 2022-04-17

## 2020-03-08 MED ORDER — TRIAMCINOLONE ACETONIDE 0.1 % EX CREA
1.0000 | TOPICAL_CREAM | Freq: Two times a day (BID) | CUTANEOUS | 0 refills | Status: DC
Start: 2020-03-08 — End: 2021-05-12

## 2020-03-08 NOTE — Discharge Instructions (Addendum)
Apply the lidex cream to hands as directed Apply the triamcinolone to forearms Use creams for up to 10 days until symptoms improve  Use eucerin daily  Call the internal medicine center to establish with a primary care provider

## 2020-03-08 NOTE — ED Triage Notes (Signed)
Pt presents with rash on bilateral hands and arms x 1 week. Reports the area is itchy. Unsure what cause is. Denies taking any otc medications.

## 2020-03-08 NOTE — ED Provider Notes (Signed)
[facility]    CSN: [id number] Arrival date & time: 03/08/20  1416      History   Chief Complaint Chief Complaint  Patient presents with  . Rash    HPI [name] is a 64 y.o. female.   Patient with history of eczema presents for eczema flare on hands and forearms.  She reports this flare is been present for about a week.  She reports dry itchy skin on the back of her hands and her forearms.  She reports she works in a [employer] frequently has to wash her hands in soapy water.  She has not had any steroid creams to use.  She uses lotions daily.  Does not have a primary care and has not seen a dermatologist.  Denies rash elsewhere.     Past Medical History:  Diagnosis Date  . Hypertension     There are no problems to display for this patient.   Past Surgical History:  Procedure Laterality Date  . ABDOMINAL HYSTERECTOMY      OB History   No obstetric history on file.      Home Medications    Prior to Admission medications   Medication Sig Start Date End Date Taking? Authorizing Provider  fluocinonide-emollient (LIDEX-E) 0.05 % cream Apply 1 application topically 2 (two) times daily. 03/08/20   Kinslie Hove, Veryl Speak, PA-C  ibuprofen (ADVIL) 800 MG tablet Take 1 tablet (800 mg total) by mouth every 6 (six) hours as needed. 01/02/20   Candelaria Stagers, DPM  meloxicam (MOBIC) 15 MG tablet Take 1 tablet (15 mg total) by mouth daily as needed for pain. 06/15/19   Christena Deem, MD  Skin Protectants, Misc. (EUCERIN) cream Apply topically as needed for dry skin. 03/08/20   Arabell Neria, Veryl Speak, PA-C  triamcinolone cream (KENALOG) 0.1 % Apply 1 application topically 2 (two) times daily. 03/08/20   Fernandez Kenley, Veryl Speak, PA-C    Family History Family History  Problem Relation Age of Onset  . Healthy Mother   . Healthy Father     Social History Social History   Tobacco Use  . Smoking status: Current Every Day Smoker    Packs/day: 1.00  . Smokeless tobacco: Never Used  Vaping  Use  . Vaping Use: Never used  Substance Use Topics  . Alcohol use: Yes    Comment: occ  . Drug use: No     Allergies   Patient has no known allergies.   Review of Systems Review of Systems   Physical Exam Triage Vital Signs ED Triage Vitals  Enc Vitals Group     BP      Pulse      Resp      Temp      Temp src      SpO2      Weight      Height      Head Circumference      Peak Flow      Pain Score      Pain Loc      Pain Edu?      Excl. in GC?    No data found.  Updated Vital Signs BP (!) 170/99   Pulse 70   Temp 98 F (36.7 C)   Resp 18   SpO2 100%   Visual Acuity Right Eye Distance:   Left Eye Distance:   Bilateral Distance:    Right Eye Near:   Left Eye Near:    Bilateral Near:  Physical Exam Vitals and nursing note reviewed.  Constitutional:      Appearance: Normal appearance.  Skin:    Comments: Dry patchy eczematous rash on the dorsum of both hands and dorsal forearms.  No lesions on palms or between fingers.  There are no vesicles, urticaria or pustules.  No other rash.  Neurological:     General: No focal deficit present.     Mental Status: She is alert and oriented to person, place, and time.      UC Treatments / Results  Labs (all labs ordered are listed, but only abnormal results are displayed) Labs Reviewed - No data to display  EKG   Radiology No results found.  Procedures Procedures (including critical care time)  Medications Ordered in UC Medications - No data to display  Initial Impression / Assessment and Plan / UC Course  I have reviewed the triage vital signs and the nursing notes.  Pertinent labs & imaging results that were available during my care of the patient were reviewed by me and considered in my medical decision making (see chart for details).     #Eczema Patient is a 64 year old with history of eczema presenting with eczema flare.  Will use Lidex on hands and triamcinolone forearms.  Recommend  Eucerin cream.  Primary care resource given encouraged her to follow-up for long-term maintenance.  Patient verbalized understand plan of care Final Clinical Impressions(s) / UC Diagnoses   Final diagnoses:  Eczema, unspecified type     Discharge Instructions     Apply the lidex cream to hands as directed Apply the triamcinolone to forearms Use creams for up to 10 days until symptoms improve  Use eucerin daily  Call the internal medicine center to establish with a primary care provider     ED Prescriptions    Medication Sig Dispense Auth. Provider   fluocinonide-emollient (LIDEX-E) 0.05 % cream Apply 1 application topically 2 (two) times daily. 30 g Ryley Teater, Veryl Speak, PA-C   triamcinolone cream (KENALOG) 0.1 % Apply 1 application topically 2 (two) times daily. 30 g Kaislyn Gulas, Veryl Speak, PA-C   Skin Protectants, Misc. (EUCERIN) cream Apply topically as needed for dry skin. 454 g Evia Goldsmith, Veryl Speak, PA-C     PDMP not reviewed this encounter.   Hermelinda Medicus, PA-C 03/08/20 1658

## 2020-07-05 ENCOUNTER — Other Ambulatory Visit: Payer: Self-pay | Admitting: Podiatry

## 2020-07-05 DIAGNOSIS — M2011 Hallux valgus (acquired), right foot: Secondary | ICD-10-CM

## 2021-05-12 ENCOUNTER — Encounter (HOSPITAL_COMMUNITY): Payer: Self-pay

## 2021-05-12 ENCOUNTER — Other Ambulatory Visit: Payer: Self-pay

## 2021-05-12 ENCOUNTER — Ambulatory Visit (INDEPENDENT_AMBULATORY_CARE_PROVIDER_SITE_OTHER)
Admission: EM | Admit: 2021-05-12 | Discharge: 2021-05-12 | Disposition: A | Discharge status: Home / Self Care | Payer: Medicare Other | Source: Home / Self Care

## 2021-05-12 ENCOUNTER — Emergency Department (HOSPITAL_COMMUNITY)
Admission: EM | Admit: 2021-05-12 | Discharge: 2021-05-13 | Disposition: A | Discharge status: Home / Self Care | Payer: Medicare Other | Attending: Student | Admitting: Student

## 2021-05-12 ENCOUNTER — Encounter (HOSPITAL_COMMUNITY): Payer: Self-pay | Admitting: Emergency Medicine

## 2021-05-12 DIAGNOSIS — R112 Nausea with vomiting, unspecified: Secondary | ICD-10-CM

## 2021-05-12 DIAGNOSIS — M545 Low back pain, unspecified: Secondary | ICD-10-CM | POA: Insufficient documentation

## 2021-05-12 DIAGNOSIS — I10 Essential (primary) hypertension: Secondary | ICD-10-CM | POA: Insufficient documentation

## 2021-05-12 DIAGNOSIS — N3000 Acute cystitis without hematuria: Secondary | ICD-10-CM | POA: Diagnosis not present

## 2021-05-12 DIAGNOSIS — R1031 Right lower quadrant pain: Secondary | ICD-10-CM | POA: Insufficient documentation

## 2021-05-12 DIAGNOSIS — F17210 Nicotine dependence, cigarettes, uncomplicated: Secondary | ICD-10-CM | POA: Diagnosis not present

## 2021-05-12 DIAGNOSIS — R109 Unspecified abdominal pain: Secondary | ICD-10-CM

## 2021-05-12 DIAGNOSIS — R1033 Periumbilical pain: Secondary | ICD-10-CM | POA: Insufficient documentation

## 2021-05-12 LAB — CBC WITH DIFFERENTIAL/PLATELET
Abs Immature Granulocytes: 0.02 10*3/uL (ref 0.00–0.07)
Basophils Absolute: 0 10*3/uL (ref 0.0–0.1)
Basophils Relative: 0 %
Eosinophils Absolute: 0 10*3/uL (ref 0.0–0.5)
Eosinophils Relative: 1 %
HCT: 38.6 % (ref 36.0–46.0)
Hemoglobin: 12.7 g/dL (ref 12.0–15.0)
Immature Granulocytes: 0 %
Lymphocytes Relative: 22 %
Lymphs Abs: 1.7 10*3/uL (ref 0.7–4.0)
MCH: 31 pg (ref 26.0–34.0)
MCHC: 32.9 g/dL (ref 30.0–36.0)
MCV: 94.1 fL (ref 80.0–100.0)
Monocytes Absolute: 0.4 10*3/uL (ref 0.1–1.0)
Monocytes Relative: 5 %
Neutro Abs: 5.4 10*3/uL (ref 1.7–7.7)
Neutrophils Relative %: 72 %
Platelets: 263 10*3/uL (ref 150–400)
RBC: 4.1 MIL/uL (ref 3.87–5.11)
RDW: 12.6 % (ref 11.5–15.5)
WBC: 7.5 10*3/uL (ref 4.0–10.5)
nRBC: 0 % (ref 0.0–0.2)

## 2021-05-12 LAB — POCT URINALYSIS DIPSTICK, ED / UC
Bilirubin Urine: NEGATIVE
Glucose, UA: NEGATIVE mg/dL
Hgb urine dipstick: NEGATIVE
Ketones, ur: NEGATIVE mg/dL
Nitrite: POSITIVE — AB
Protein, ur: 30 mg/dL — AB
Specific Gravity, Urine: 1.025 (ref 1.005–1.030)
Urobilinogen, UA: 1 mg/dL (ref 0.0–1.0)
pH: 6 (ref 5.0–8.0)

## 2021-05-12 LAB — BASIC METABOLIC PANEL
Anion gap: 8 (ref 5–15)
BUN: 9 mg/dL (ref 8–23)
CO2: 27 mmol/L (ref 22–32)
Calcium: 9.3 mg/dL (ref 8.9–10.3)
Chloride: 103 mmol/L (ref 98–111)
Creatinine, Ser: 0.58 mg/dL (ref 0.44–1.00)
GFR, Estimated: >60 mL/min (ref >60)
Glucose, Bld: 112 mg/dL — ABNORMAL HIGH (ref 70–99)
Potassium: 3.8 mmol/L (ref 3.5–5.1)
Sodium: 138 mmol/L (ref 135–145)

## 2021-05-12 MED ORDER — TIZANIDINE HCL 2 MG PO CAPS: 2.0000 mg | ORAL_CAPSULE | Freq: Two times a day (BID) | ORAL | 0 refills | Status: DC | PRN

## 2021-05-12 MED ORDER — CEPHALEXIN 500 MG PO CAPS: 500.0000 mg | ORAL_CAPSULE | Freq: Three times a day (TID) | ORAL | 0 refills | Status: DC

## 2021-05-12 NOTE — Discharge Instructions (Signed)
You have a urinary tract infection I believe this is contributing to your lower back pain symptoms.  Please start Keflex as prescribed.  We will send her urine off for culture and if this is positive and we need to change antibiotics we will let you know.  Please rest and drink plenty of fluid.  I have also called in a low-dose muscle relaxer.  You can take this twice a day to help with your pain.  This will make you sleepy do not drive or drink alcohol taking it.  Use heat, massage, stretch for additional symptom relief.  Avoid any strenuous activities.  If you have any worsening symptoms including abdominal pain, fever, nausea, vomiting, blood in your urine, worsening pain, difficulty moving your legs, you need to go to the emergency room.

## 2021-05-12 NOTE — ED Provider Notes (Addendum)
MSE was initiated and I personally evaluated the patient and placed orders (if any) at  10:06 PM on May 12, 2021.  Right flank pain since Saturday. No urinary symptoms, no dysuria. Worse with movement, no better with rest. No fever, nausea, vomiting.  Patient adds that she has had bloody stool "for a while" without evaluation.   Today's Vitals   05/12/21 2152 05/12/21 2153 05/12/21 2154  BP: (!) 164/98    Pulse: 68    Resp: 18    Temp: 98.9 F (37.2 C)    TempSrc: Oral    SpO2: 99%    Weight:   73.5 kg  Height:   5\' 3"  (1.6 m)  PainSc:  10-Worst pain ever    Body mass index is 28.7 kg/m.  Well, comfortable appearing Reproducible right flank pain Benign abdomen  The patient appears stable so that the remainder of the MSE may be completed by another provider.   , PA-C 05/12/21 2209    2210, PA-C 05/12/21 2210    13/06/22, MD 05/13/21 2222

## 2021-05-12 NOTE — ED Provider Notes (Signed)
[facility]    CSN: [id number] Arrival date & time: 05/12/21  1057      History   Chief Complaint Chief Complaint  Patient presents with   Emesis    HPI [name] is a 65 y.o. female.   Patient presents today with a weeklong history of worsening right lower back pain.  She reports pain is rated 8 on a 0-10 pain scale, described as intense aching, worse when she wakes up in the morning, no alleviating factors identified.  She does have a physically demanding job but denies any recent injury or change in activity.  She is having difficulty with daily activities as result of symptoms and cannot sleep through the night due to pain.  She denies any significant urinary symptoms including frequency, urgency, hematuria.  She did have 2 episodes of emesis with last episode yesterday evening.  She has been eating and drinking normally.  Denies history of nephrolithiasis.  She denies previous spinal surgery.  She has not tried any over-the-counter medication for symptom management.  Denies any lower extremity weakness, saddle anesthesia, bowel/bladder incontinence.   Past Medical History:  Diagnosis Date   Hypertension     There are no problems to display for this patient.   Past Surgical History:  Procedure Laterality Date   ABDOMINAL HYSTERECTOMY      OB History   No obstetric history on file.      Home Medications    Prior to Admission medications   Medication Sig Start Date End Date Taking? Authorizing Provider  cephALEXin (KEFLEX) 500 MG capsule Take 1 capsule (500 mg total) by mouth 3 (three) times daily. 05/12/21  Yes Patton Swisher K, PA-C  tizanidine (ZANAFLEX) 2 MG capsule Take 1 capsule (2 mg total) by mouth 2 (two) times daily as needed for muscle spasms. 05/12/21  Yes Archibald Marchetta, Noberto Retort, PA-C  fluocinonide-emollient (LIDEX-E) 0.05 % cream Apply 1 application topically 2 (two) times daily. 03/08/20   Darr, Gerilyn Pilgrim, PA-C  ibuprofen (ADVIL) 800 MG tablet Take 1  tablet (800 mg total) by mouth every 6 (six) hours as needed. 01/02/20   Candelaria Stagers, DPM  Skin Protectants, Misc. (EUCERIN) cream Apply topically as needed for dry skin. 03/08/20   Darr, Gerilyn Pilgrim, PA-C    Family History Family History  Problem Relation Age of Onset   Healthy Mother    Healthy Father     Social History Social History   Tobacco Use   Smoking status: Every Day    Packs/day: 1.00    Types: Cigarettes   Smokeless tobacco: Never  Vaping Use   Vaping Use: Never used  Substance Use Topics   Alcohol use: Yes    Comment: occ   Drug use: No     Allergies   Patient has no known allergies.   Review of Systems Review of Systems  Constitutional:  Positive for activity change. Negative for appetite change, fatigue and fever.  Respiratory:  Negative for cough and shortness of breath.   Cardiovascular:  Negative for chest pain.  Gastrointestinal:  Positive for nausea and vomiting. Negative for abdominal pain and diarrhea.  Genitourinary:  Negative for dysuria, frequency and urgency.  Musculoskeletal:  Positive for back pain. Negative for arthralgias and myalgias.    Physical Exam Triage Vital Signs ED Triage Vitals  Enc Vitals Group     BP 05/12/21 1245 (!) 155/96     Pulse Rate 05/12/21 1245 66     Resp 05/12/21 1245 18  Temp 05/12/21 1245 98.7 F (37.1 C)     Temp Source 05/12/21 1245 Oral     SpO2 05/12/21 1245 96 %     Weight --      Height --      Head Circumference --      Peak Flow --      Pain Score 05/12/21 1244 8     Pain Loc --      Pain Edu? --      Excl. in [place]? --    No data found.  Updated Vital Signs BP (!) 155/96 (BP Location: Right Arm)   Pulse 66   Temp 98.7 F (37.1 C) (Oral)   Resp 18   SpO2 96%   Visual Acuity Right Eye Distance:   Left Eye Distance:   Bilateral Distance:    Right Eye Near:   Left Eye Near:    Bilateral Near:     Physical Exam Vitals reviewed.  Constitutional:      General: She is awake. She is  not in acute distress.    Appearance: Normal appearance. She is well-developed. She is not ill-appearing.     Comments: Very pleasant female appears stated age in no acute distress sitting comfortably in exam room  HENT:     Head: Normocephalic and atraumatic.     Nose: Nose normal.  Cardiovascular:     Rate and Rhythm: Normal rate and regular rhythm.     Heart sounds: Normal heart sounds, S1 normal and S2 normal. No murmur heard. Pulmonary:     Effort: Pulmonary effort is normal.     Breath sounds: Normal breath sounds. No wheezing, rhonchi or rales.     Comments: Clear to auscultation bilaterally Abdominal:     General: Bowel sounds are normal.     Palpations: Abdomen is soft.     Tenderness: There is no abdominal tenderness. There is no right CVA tenderness, left CVA tenderness, guarding or rebound.     Comments: Benign abdominal exam; no CVA tenderness.  Musculoskeletal:     Cervical back: No tenderness or bony tenderness.     Thoracic back: No tenderness or bony tenderness.     Lumbar back: No tenderness or bony tenderness.     Right lower leg: No edema.     Left lower leg: No edema.  Psychiatric:        Behavior: Behavior is cooperative.     UC Treatments / Results  Labs (all labs ordered are listed, but only abnormal results are displayed) Labs Reviewed  POCT URINALYSIS DIPSTICK, ED / UC - Abnormal; Notable for the following components:      Result Value   Protein, ur 30 (*)    Nitrite POSITIVE (*)    Leukocytes,Ua MODERATE (*)    All other components within normal limits  URINE CULTURE    EKG   Radiology No results found.  Procedures Procedures (including critical care time)  Medications Ordered in UC Medications - No data to display  Initial Impression / Assessment and Plan / UC Course  I have reviewed the triage vital signs and the nursing notes.  Pertinent labs & imaging results that were available during my care of the patient were reviewed by me  and considered in my medical decision making (see chart for details).     No alarm symptoms that warrant emergent evaluation.  Patient denies any recent trauma and has no bony tenderness and will defer plain films.  Suspect UTI are  causing back pain based on UA positive for nitrates.  We will start patient on Keflex and send for culture.  Discussed potential need to change antibiotics based on culture results.  Recommended patient use over-the-counter analgesics for pain relief and she was prescribed a low-dose muscle relaxer.  Discussed this can be sedating she is to not drive or drink alcohol while taking it.  Encouraged her to use conservative treatment measures including heat, rest, stretch for additional symptom relief.  Discussed alarm symptoms that warrant emergent evaluation.  Strict return precautions given to which she expressed understanding.  Final Clinical Impressions(s) / UC Diagnoses   Final diagnoses:  Acute right-sided low back pain without sciatica  Nausea and vomiting, unspecified vomiting type  Acute cystitis without hematuria     Discharge Instructions      You have a urinary tract infection I believe this is contributing to your lower back pain symptoms.  Please start Keflex as prescribed.  We will send her urine off for culture and if this is positive and we need to change antibiotics we will let you know.  Please rest and drink plenty of fluid.  I have also called in a low-dose muscle relaxer.  You can take this twice a day to help with your pain.  This will make you sleepy do not drive or drink alcohol taking it.  Use heat, massage, stretch for additional symptom relief.  Avoid any strenuous activities.  If you have any worsening symptoms including abdominal pain, fever, nausea, vomiting, blood in your urine, worsening pain, difficulty moving your legs, you need to go to the emergency room.     ED Prescriptions     Medication Sig Dispense Auth. Provider   tizanidine  (ZANAFLEX) 2 MG capsule Take 1 capsule (2 mg total) by mouth 2 (two) times daily as needed for muscle spasms. 20 capsule Jontez Redfield K, PA-C   cephALEXin (KEFLEX) 500 MG capsule Take 1 capsule (500 mg total) by mouth 3 (three) times daily. 21 capsule Aerabella Galasso K, PA-C      PDMP not reviewed this encounter.   Terrilee Croak, PA-C 05/12/21 1411

## 2021-05-12 NOTE — ED Triage Notes (Signed)
Pt from home, Saturday she had right flank.  Pt reports a "different color pee." And it "sometimes burns" when she urinates.

## 2021-05-12 NOTE — ED Triage Notes (Signed)
Pt reports right sided lower back pain and vomited 2 times 1 day ago.

## 2021-05-13 ENCOUNTER — Telehealth: Payer: Self-pay | Admitting: *Deleted

## 2021-05-13 ENCOUNTER — Emergency Department (HOSPITAL_COMMUNITY): Payer: Medicare Other

## 2021-05-13 LAB — URINALYSIS, ROUTINE W REFLEX MICROSCOPIC
Bilirubin Urine: NEGATIVE
Glucose, UA: NEGATIVE mg/dL
Hgb urine dipstick: NEGATIVE
Ketones, ur: NEGATIVE mg/dL
Nitrite: POSITIVE — AB
Protein, ur: 30 mg/dL — AB
Specific Gravity, Urine: 1.019 (ref 1.005–1.030)
pH: 6 (ref 5.0–8.0)

## 2021-05-13 LAB — POC OCCULT BLOOD, ED: Fecal Occult Bld: NEGATIVE

## 2021-05-13 MED ORDER — ONDANSETRON HCL 4 MG/2ML IJ SOLN
4.0000 mg | Freq: Once | INTRAMUSCULAR | Status: AC
  Administered 2021-05-13: 4 mg via INTRAVENOUS
  Filled 2021-05-13: qty 2

## 2021-05-13 MED ORDER — SODIUM CHLORIDE 0.9 % IV BOLUS
1000.0000 mL | Freq: Once | INTRAVENOUS | Status: AC
  Administered 2021-05-13: 1000 mL via INTRAVENOUS

## 2021-05-13 MED ORDER — IOHEXOL 300 MG/ML  SOLN
100.0000 mL | Freq: Once | INTRAMUSCULAR | Status: AC | PRN
  Administered 2021-05-13: 100 mL via INTRAVENOUS

## 2021-05-13 MED ORDER — ONDANSETRON HCL 4 MG PO TABS: 4.0000 mg | ORAL_TABLET | Freq: Four times a day (QID) | ORAL | 0 refills | Status: DC

## 2021-05-13 MED ORDER — MORPHINE SULFATE (PF) 4 MG/ML IV SOLN
4.0000 mg | Freq: Once | INTRAVENOUS | Status: AC
  Administered 2021-05-13: 4 mg via INTRAVENOUS
  Filled 2021-05-13: qty 1

## 2021-05-13 MED ORDER — KETOROLAC TROMETHAMINE 15 MG/ML IJ SOLN
15.0000 mg | Freq: Once | INTRAMUSCULAR | Status: AC
  Administered 2021-05-13: 15 mg via INTRAVENOUS
  Filled 2021-05-13: qty 1

## 2021-05-13 MED ORDER — IBUPROFEN 600 MG PO TABS: 600.0000 mg | ORAL_TABLET | Freq: Four times a day (QID) | ORAL | 0 refills | Status: DC | PRN

## 2021-05-13 NOTE — Discharge Instructions (Addendum)
All of your tests look good today.  Your blood work is normal, there are no abnormalities on your CT scan.  There is no blood in your stool.  You do have a urinary tract infection.  At this point I believe it is important for you to finish your antibiotics and follow-up with your primary care provider.  There is one attached to these discharge papers.  Also, please read the information about flank pain and urinary tract infections attached to these papers.  I have sent pain medication as well as nausea medication to your pharmacy.  It was a pleasure to meet you and I hope that you feel better.

## 2021-05-13 NOTE — ED Notes (Signed)
Pt verbalizes understanding of discharge instructions. Opportunity for questions and answers were provided. Pt discharged from the ED.   ?

## 2021-05-13 NOTE — ED Provider Notes (Signed)
[facility] EMERGENCY DEPARTMENT Provider Note   CSN: [id number] Arrival date & time: 05/12/21  2011     History Chief Complaint  Patient presents with   Flank Pain    [name] is a 65 y.o. female with a pmh of HTN presenting with right flank pain and known UTI.  She was seen at urgent care and discharged with Keflex and tizanidine.  She is returning today because she is having an increase in pain in her right flank and right lower quadrant.  Also endorsing some melena and hematochezia.  Not on blood thinner.  Denies fevers, chills but has developed some dysuria.  History of abdominal hysterectomy to me many years ago but no other surgeries.  This is happened in the past however she does not remember what the ultimate diagnosis was.  Still having bowel movements and passing gas however occasionally feels constipated.  3 days of nonbloody emesis.      Past Medical History:  Diagnosis Date   Hypertension     There are no problems to display for this patient.   Past Surgical History:  Procedure Laterality Date   ABDOMINAL HYSTERECTOMY       OB History   No obstetric history on file.     Family History  Problem Relation Age of Onset   Healthy Mother    Healthy Father     Social History   Tobacco Use   Smoking status: Every Day    Packs/day: 1.00    Types: Cigarettes   Smokeless tobacco: Never  Vaping Use   Vaping Use: Never used  Substance Use Topics   Alcohol use: Yes    Comment: occ   Drug use: No    Home Medications Prior to Admission medications   Medication Sig Start Date End Date Taking? Authorizing Provider  cephALEXin (KEFLEX) 500 MG capsule Take 1 capsule (500 mg total) by mouth 3 (three) times daily. 05/12/21   Raspet, Noberto Retort, PA-C  fluocinonide-emollient (LIDEX-E) 0.05 % cream Apply 1 application topically 2 (two) times daily. 03/08/20   Darr, Gerilyn Pilgrim, PA-C  ibuprofen (ADVIL) 800 MG tablet Take 1 tablet (800 mg total) by  mouth every 6 (six) hours as needed. 01/02/20   Candelaria Stagers, DPM  Skin Protectants, Misc. (EUCERIN) cream Apply topically as needed for dry skin. 03/08/20   Darr, Gerilyn Pilgrim, PA-C  tizanidine (ZANAFLEX) 2 MG capsule Take 1 capsule (2 mg total) by mouth 2 (two) times daily as needed for muscle spasms. 05/12/21   Raspet, Noberto Retort, PA-C    Allergies    Patient has no known allergies.  Review of Systems   Review of Systems  Constitutional:  Negative for chills and fever.  Respiratory:  Negative for shortness of breath.   Cardiovascular:  Negative for chest pain.  Gastrointestinal:  Positive for blood in stool, constipation, nausea and vomiting.  Genitourinary:  Positive for dysuria and flank pain. Negative for hematuria, vaginal bleeding and vaginal discharge.  All other systems reviewed and are negative.  Physical Exam Updated Vital Signs BP (!) 157/104   Pulse 75   Temp 98.1 F (36.7 C) (Oral)   Resp 18   Ht 5\' 3"  (1.6 m)   Wt 73.5 kg   SpO2 100%   BMI 28.70 kg/m   Physical Exam Vitals and nursing note reviewed.  Constitutional:      Appearance: Normal appearance.  HENT:     Head: Normocephalic and atraumatic.  Eyes:  General: No scleral icterus.    Conjunctiva/sclera: Conjunctivae normal.  Cardiovascular:     Rate and Rhythm: Normal rate and regular rhythm.  Pulmonary:     Effort: Pulmonary effort is normal. No respiratory distress.     Breath sounds: No rales.  Abdominal:     General: There is distension.     Tenderness: There is abdominal tenderness (RLQ and umbilical). There is right CVA tenderness. There is no left CVA tenderness.  Skin:    General: Skin is warm and dry.     Findings: No rash.  Neurological:     Mental Status: She is alert.  Psychiatric:        Mood and Affect: Mood normal.        Behavior: Behavior normal.    ED Results / Procedures / Treatments   Labs (all labs ordered are listed, but only abnormal results are displayed) Labs Reviewed   BASIC METABOLIC PANEL - Abnormal; Notable for the following components:      Result Value   Glucose, Bld 112 (*)    All other components within normal limits  CBC WITH DIFFERENTIAL/PLATELET  URINALYSIS, ROUTINE W REFLEX MICROSCOPIC    EKG None  Radiology CT ABDOMEN PELVIS W CONTRAST  Result Date: 05/13/2021 CLINICAL DATA:  Abdominal and right flank pain. EXAM: CT ABDOMEN AND PELVIS WITH CONTRAST TECHNIQUE: Multidetector CT imaging of the abdomen and pelvis was performed using the standard protocol following bolus administration of intravenous contrast. CONTRAST:  165mL OMNIPAQUE IOHEXOL 300 MG/ML  SOLN COMPARISON:  CT stone study 04/05/2016 FINDINGS: Lower chest: 5 mm right lower lobe nodule on image 5/series 4 is new in the interval. Hepatobiliary: No suspicious focal abnormality within the liver parenchyma. There is no evidence for gallstones, gallbladder wall thickening, or pericholecystic fluid. No intrahepatic or extrahepatic biliary dilation. Pancreas: No focal mass lesion. No dilatation of the main duct. No intraparenchymal cyst. No peripancreatic edema. Spleen: No splenomegaly. No focal mass lesion. Adrenals/Urinary Tract: No adrenal nodule or mass. Kidneys unremarkable. No evidence for hydroureter. The urinary bladder appears normal for the degree of distention. Stomach/Bowel: Stomach is nondistended. Duodenum is normally positioned as is the ligament of Treitz. No small bowel wall thickening. No small bowel dilatation. The terminal ileum is normal. The appendix is not well visualized, but there is no edema or inflammation in the region of the cecum. Diverticuli are seen scattered along the entire length of the colon without CT findings of diverticulitis. Vascular/Lymphatic: There is mild atherosclerotic calcification of the abdominal aorta without aneurysm. There is no gastrohepatic or hepatoduodenal ligament lymphadenopathy. No retroperitoneal or mesenteric lymphadenopathy. No pelvic  sidewall lymphadenopathy. Reproductive: Uterus surgically absent.  There is no adnexal mass. Other: No intraperitoneal free fluid. Musculoskeletal: No worrisome lytic or sclerotic osseous abnormality. IMPRESSION: 1. No acute findings in the abdomen or pelvis. Specifically, no findings to explain the patient's history of right flank pain. 2. Diffuse colonic diverticulosis without diverticulitis. 3. 5 mm right lower lobe pulmonary nodule, new in the interval. No follow-up needed if patient is low-risk. Non-contrast chest CT can be considered in 12 months if patient is high-risk. This recommendation follows the consensus statement: Guidelines for Management of Incidental Pulmonary Nodules Detected on CT Images: From the Fleischner Society 2017; Radiology 2017; 284:228-243. 4. Aortic Atherosclerosis (ICD10-I70.0). Electronically Signed   By: Misty Stanley M.D.   On: 05/13/2021 09:57    Procedures Procedures   Medications Ordered in ED Medications  ketorolac (TORADOL) 15 MG/ML injection 15 mg (has  no administration in time range)  sodium chloride 0.9 % bolus 1,000 mL (has no administration in time range)  ondansetron (ZOFRAN) injection 4 mg (has no administration in time range)    ED Course  I have reviewed the triage vital signs and the nursing notes.  Pertinent labs & imaging results that were available during my care of the patient were reviewed by me and considered in my medical decision making (see chart for details).    MDM Rules/Calculators/A&P The differential diagnosis of emergent flank pain includes, but is not limited to : AAA, renal artery embolism, renal vein thrombosis, mesenteric ischemia, pyelonephritis, renal infarction, nephrolithiasis/ renal colic, cystitis, biliary colic, pancreatitis, peptic ulcer, appendicitis and diverticulitis. All were considered throughout the evaluation of this patient.   She was seen yesterday urgent care and diagnosed with a urinary tract infection.  She  was started on Keflex and tizanidine however she returned to the emergency department due to an increase in her pain.  She also endorsed some melena/hematochezia.  Rectal exam nonrevealing of internal or external hemorrhoids.  No noted fissures.  Hemoccult negative.  Her abdomen was distended.  Bowel sounds were present.  Low suspicion for bowel obstruction, still having bowel movements and passing gas.  Afebrile and not tachycardic.  Well-appearing, low suspicion for pyelonephritis.  CT scan negative.  Small lung nodule noted, this was discussed with the patient.  She does not have a primary care provider so 1 is attached to her discharge papers.  We discussed the importance of taking the antibiotic prescribed to her by urgent care.  Will d/c tizanidine. Ibuprofen sent to pharmacy.  Urinalysis pending, likely to reveal UTI.  At this time the patient is ambulatory and stable for discharge home.  Return precautions discussed.  Information about flank pain and urinary tract infections attached to her discharge papers.  Urinalysis shows UTI as expected.  Patient will continue her antibiotics.  Final Clinical Impression(s) / ED Diagnoses Final diagnoses:  Right flank pain    Rx / DC Orders Results and diagnoses were explained to the patient. Return precautions discussed in full. Patient had no additional questions and expressed complete understanding.     Darliss Ridgel 05/13/21 1114    Teressa Lower, MD 05/13/21 1530

## 2021-05-13 NOTE — Telephone Encounter (Signed)
Pt daughter called regarding pharmacy not receiving faxed Rx.  RNCM reviewed chart and called in as written.

## 2021-05-14 LAB — URINE CULTURE: Culture: >=100000 — AB

## 2022-04-17 ENCOUNTER — Ambulatory Visit (HOSPITAL_COMMUNITY)
Admission: EM | Admit: 2022-04-17 | Discharge: 2022-04-17 | Disposition: A | Discharge status: Home / Self Care | Payer: Medicare Other | Attending: Physician Assistant | Admitting: Physician Assistant

## 2022-04-17 ENCOUNTER — Encounter (HOSPITAL_COMMUNITY): Payer: Self-pay | Admitting: Emergency Medicine

## 2022-04-17 DIAGNOSIS — K529 Noninfective gastroenteritis and colitis, unspecified: Secondary | ICD-10-CM | POA: Insufficient documentation

## 2022-04-17 DIAGNOSIS — K625 Hemorrhage of anus and rectum: Secondary | ICD-10-CM | POA: Diagnosis present

## 2022-04-17 DIAGNOSIS — R197 Diarrhea, unspecified: Secondary | ICD-10-CM | POA: Insufficient documentation

## 2022-04-17 DIAGNOSIS — R112 Nausea with vomiting, unspecified: Secondary | ICD-10-CM | POA: Insufficient documentation

## 2022-04-17 DIAGNOSIS — K648 Other hemorrhoids: Secondary | ICD-10-CM | POA: Insufficient documentation

## 2022-04-17 HISTORY — DX: Unspecified osteoarthritis, unspecified site: M19.90

## 2022-04-17 LAB — CBC WITH DIFFERENTIAL/PLATELET
Abs Immature Granulocytes: 0.01 10*3/uL (ref 0.00–0.07)
Basophils Absolute: 0 10*3/uL (ref 0.0–0.1)
Basophils Relative: 1 %
Eosinophils Absolute: 0.1 10*3/uL (ref 0.0–0.5)
Eosinophils Relative: 2 %
HCT: 39.9 % (ref 36.0–46.0)
Hemoglobin: 13.5 g/dL (ref 12.0–15.0)
Immature Granulocytes: 0 %
Lymphocytes Relative: 35 %
Lymphs Abs: 1.7 10*3/uL (ref 0.7–4.0)
MCH: 31.4 pg (ref 26.0–34.0)
MCHC: 33.8 g/dL (ref 30.0–36.0)
MCV: 92.8 fL (ref 80.0–100.0)
Monocytes Absolute: 0.3 10*3/uL (ref 0.1–1.0)
Monocytes Relative: 6 %
Neutro Abs: 2.7 10*3/uL (ref 1.7–7.7)
Neutrophils Relative %: 56 %
Platelets: 280 10*3/uL (ref 150–400)
RBC: 4.3 MIL/uL (ref 3.87–5.11)
RDW: 13 % (ref 11.5–15.5)
WBC: 4.8 10*3/uL (ref 4.0–10.5)
nRBC: 0 % (ref 0.0–0.2)

## 2022-04-17 LAB — COMPREHENSIVE METABOLIC PANEL
ALT: 21 U/L (ref 0–44)
AST: 25 U/L (ref 15–41)
Albumin: 3.7 g/dL (ref 3.5–5.0)
Alkaline Phosphatase: 67 U/L (ref 38–126)
Anion gap: 4 — ABNORMAL LOW (ref 5–15)
BUN: 5 mg/dL — ABNORMAL LOW (ref 8–23)
CO2: 28 mmol/L (ref 22–32)
Calcium: 8.6 mg/dL — ABNORMAL LOW (ref 8.9–10.3)
Chloride: 109 mmol/L (ref 98–111)
Creatinine, Ser: 0.57 mg/dL (ref 0.44–1.00)
GFR, Estimated: >60 mL/min (ref >60)
Glucose, Bld: 83 mg/dL (ref 70–99)
Potassium: 3.9 mmol/L (ref 3.5–5.1)
Sodium: 141 mmol/L (ref 135–145)
Total Bilirubin: 0.3 mg/dL (ref 0.3–1.2)
Total Protein: 7.8 g/dL (ref 6.5–8.1)

## 2022-04-17 LAB — POC OCCULT BLOOD, ED: Fecal Occult Bld: NEGATIVE

## 2022-04-17 LAB — LIPASE, BLOOD: Lipase: 28 U/L (ref 11–51)

## 2022-04-17 MED ORDER — HYDROCORTISONE ACETATE 25 MG RE SUPP: 25.0000 mg | Freq: Two times a day (BID) | RECTAL | 0 refills | Status: DC

## 2022-04-17 MED ORDER — ONDANSETRON 4 MG PO TBDP
4.0000 mg | ORAL_TABLET | Freq: Once | ORAL | Status: AC
  Administered 2022-04-17: 4 mg via ORAL

## 2022-04-17 MED ORDER — ONDANSETRON 4 MG PO TBDP
ORAL_TABLET | ORAL | Status: AC
  Filled 2022-04-17: qty 1

## 2022-04-17 MED ORDER — ONDANSETRON 4 MG PO TBDP: 4.0000 mg | ORAL_TABLET | Freq: Three times a day (TID) | ORAL | 0 refills | Status: DC | PRN

## 2022-04-17 NOTE — ED Provider Notes (Signed)
[facility]    CSN: [id number] Arrival date & time: 04/17/22  7564      History   Chief Complaint Chief Complaint  Patient presents with   Emesis   Diarrhea    HPI [name] is a 66 y.o. female.   Patient resents today with a 3-day history of GI symptoms.  She reports intermittent nausea, vomiting, diarrhea.  She does report occasional bright red blood with bowel movements but states this has been ongoing for several years.  Denies formal diagnosis of hemorrhoids.  She has not been taking any over-the-counter medication for symptom management.  Denies any suspicious food intake, medication changes, recent antibiotic use.  She denies history of gastrointestinal disorder.  Denies previous abdominal surgery other than tubal ligation.  She has missed work as result of symptoms.  Denies any fever, cough, congestion, sore throat, headache, body aches.  She denies regular NSAID use though she does take them occasionally for osteoarthritis.  She denies alcohol consumption.  Does not take GLP-1 agonist.  She has not seen GI or had colonoscopy in the past.    Past Medical History:  Diagnosis Date   Arthritis    Hypertension     There are no problems to display for this patient.   Past Surgical History:  Procedure Laterality Date   ABDOMINAL HYSTERECTOMY      OB History   No obstetric history on file.      Home Medications    Prior to Admission medications   Medication Sig Start Date End Date Taking? Authorizing Provider  hydrocortisone (ANUSOL-HC) 25 MG suppository Place 1 suppository (25 mg total) rectally 2 (two) times daily. 04/17/22  Yes Malan Werk K, PA-C  ondansetron (ZOFRAN-ODT) 4 MG disintegrating tablet Take 1 tablet (4 mg total) by mouth every 8 (eight) hours as needed for nausea or vomiting. 04/17/22  Yes Peri Kreft K, PA-C  ibuprofen (ADVIL) 600 MG tablet Take 1 tablet (600 mg total) by mouth every 6 (six) hours as needed. 05/13/21    Redwine, Madison A, PA-C  Skin Protectants, Misc. (EUCERIN) cream Apply topically as needed for dry skin. 03/08/20   Darr, Gerilyn Pilgrim, PA-C    Family History Family History  Problem Relation Age of Onset   Healthy Mother    Healthy Father     Social History Social History   Tobacco Use   Smoking status: Every Day    Packs/day: 1.00    Types: Cigarettes   Smokeless tobacco: Never  Vaping Use   Vaping Use: Never used  Substance Use Topics   Alcohol use: Yes    Comment: occ   Drug use: No     Allergies   Patient has no known allergies.   Review of Systems Review of Systems  Constitutional:  Positive for activity change. Negative for appetite change, fatigue and fever.  HENT:  Negative for congestion and sore throat.   Respiratory:  Negative for cough and shortness of breath.   Cardiovascular:  Negative for chest pain.  Gastrointestinal:  Positive for abdominal pain, blood in stool, diarrhea, nausea and vomiting. Negative for constipation.  Neurological:  Negative for dizziness, light-headedness and headaches.     Physical Exam Triage Vital Signs ED Triage Vitals  Enc Vitals Group     BP 04/17/22 1002 (!) 164/89     Pulse Rate 04/17/22 1002 70     Resp 04/17/22 1002 17     Temp 04/17/22 1002 97.9 F (36.6 C)  Temp Source 04/17/22 1002 Oral     SpO2 04/17/22 1002 98 %     Weight --      Height --      Head Circumference --      Peak Flow --      Pain Score 04/17/22 1000 6     Pain Loc --      Pain Edu? --      Excl. in [place]? --    No data found.  Updated Vital Signs BP (!) 164/89 (BP Location: Left Arm)   Pulse 70   Temp 97.9 F (36.6 C) (Oral)   Resp 17   SpO2 98%   Visual Acuity Right Eye Distance:   Left Eye Distance:   Bilateral Distance:    Right Eye Near:   Left Eye Near:    Bilateral Near:     Physical Exam Vitals reviewed.  Constitutional:      General: She is awake. She is not in acute distress.    Appearance: Normal appearance. She  is well-developed. She is not ill-appearing.     Comments: Very pleasant female appears stated age in no acute distress sitting comfortably in exam room  HENT:     Head: Normocephalic and atraumatic.     Mouth/Throat:     Pharynx: Uvula midline. No oropharyngeal exudate or posterior oropharyngeal erythema.  Cardiovascular:     Rate and Rhythm: Normal rate and regular rhythm.     Heart sounds: Normal heart sounds, S1 normal and S2 normal. No murmur heard. Pulmonary:     Effort: Pulmonary effort is normal.     Breath sounds: Normal breath sounds. No wheezing, rhonchi or rales.     Comments: Clear to auscultation bilaterally Abdominal:     General: Bowel sounds are normal.     Palpations: Abdomen is soft.     Tenderness: There is generalized abdominal tenderness. There is no right CVA tenderness, left CVA tenderness, guarding or rebound.     Comments: Generalized tenderness palpation throughout abdomen.  No evidence of acute abdomen  Genitourinary:    Rectum: Internal hemorrhoid present. No mass, anal fissure or external hemorrhoid.     Comments: Dee, CMA as chaperone during exam.  Psychiatric:        Behavior: Behavior is cooperative.      UC Treatments / Results  Labs (all labs ordered are listed, but only abnormal results are displayed) Labs Reviewed  CBC WITH DIFFERENTIAL/PLATELET  COMPREHENSIVE METABOLIC PANEL  LIPASE, BLOOD  POC OCCULT BLOOD, ED    EKG   Radiology No results found.  Procedures Procedures (including critical care time)  Medications Ordered in UC Medications  ondansetron (ZOFRAN-ODT) disintegrating tablet 4 mg (4 mg Oral Given 04/17/22 1027)    Initial Impression / Assessment and Plan / UC Course  I have reviewed the triage vital signs and the nursing notes.  Pertinent labs & imaging results that were available during my care of the patient were reviewed by me and considered in my medical decision making (see chart for details).     Patient  is well-appearing, afebrile, nontoxic, nontachycardic.  Vital signs and physical exam reassuring today; no indication for emergent evaluation or imaging.  Hemoccult testing was negative in clinic.  Suspect that intermittent blood in stool is related to inflamed internal hemorrhoids from diarrhea.  Patient was given prescription for hydrocortisone suppositories and encouraged to avoid constipation and drink plenty of fluid.  Discussed that given her intermittent symptoms and that she  has not had a colonoscopy she should follow-up with GI.  She was given contact information for local provider with instruction to call to schedule appointment as soon as possible.  Basic labs including CBC, CMP, lipase obtained today-results pending.  She was encouraged to avoid NSAIDs and alcohol.  He was given Zofran to help manage nausea symptoms.  Discussed that if she has any worsening symptoms including abdominal pain, pelvic pain, fever, nausea/vomiting despite medication, melena, hematochezia, weakness she needs to go to the emergency room immediately.  Strict return precautions given.  Work excuse note provided.  Final Clinical Impressions(s) / UC Diagnoses   Final diagnoses:  Gastroenteritis  Nausea vomiting and diarrhea  BRBPR (bright red blood per rectum)  Internal hemorrhoid     Discharge Instructions      We do not see any blood in your stool today.  I do think given your symptoms it is important that you follow-up with a stomach specialist.  Please call them to schedule an appointment as soon as possible.  I believe that your blood is related to hemorrhoids irritated by the diarrhea.  Use hydrocortisone suppositories.  Eat a bland diet and drink plenty of fluid.  Use Zofran for nausea and vomiting.  We will contact you with your blood work if anything is abnormal.  Avoid alcohol and NSAIDs (aspirin, ibuprofen/Advil, naproxen/Aleve).  If you have any worsening symptoms including increased abdominal pain,  fever, nausea/vomiting interfering with oral intake, recurrent blood in your stool you need to be seen immediately.     ED Prescriptions     Medication Sig Dispense Auth. Provider   hydrocortisone (ANUSOL-HC) 25 MG suppository Place 1 suppository (25 mg total) rectally 2 (two) times daily. 12 suppository Tondalaya Perren K, PA-C   ondansetron (ZOFRAN-ODT) 4 MG disintegrating tablet Take 1 tablet (4 mg total) by mouth every 8 (eight) hours as needed for nausea or vomiting. 20 tablet Julen Rubert, Noberto Retort, PA-C      PDMP not reviewed this encounter.   Jeani Hawking, PA-C 04/17/22 1058

## 2022-04-17 NOTE — ED Triage Notes (Signed)
Pt reports since Tuesday having n/v/d. Tried to go to work today but had to leave.

## 2022-04-17 NOTE — Discharge Instructions (Signed)
We do not see any blood in your stool today.  I do think given your symptoms it is important that you follow-up with a stomach specialist.  Please call them to schedule an appointment as soon as possible.  I believe that your blood is related to hemorrhoids irritated by the diarrhea.  Use hydrocortisone suppositories.  Eat a bland diet and drink plenty of fluid.  Use Zofran for nausea and vomiting.  We will contact you with your blood work if anything is abnormal.  Avoid alcohol and NSAIDs (aspirin, ibuprofen/Advil, naproxen/Aleve).  If you have any worsening symptoms including increased abdominal pain, fever, nausea/vomiting interfering with oral intake, recurrent blood in your stool you need to be seen immediately.

## 2023-04-21 ENCOUNTER — Ambulatory Visit (HOSPITAL_COMMUNITY)
Admission: EM | Admit: 2023-04-21 | Discharge: 2023-04-21 | Disposition: A | Discharge status: Home / Self Care | Payer: Medicare Other | Attending: Internal Medicine | Admitting: Internal Medicine

## 2023-04-21 ENCOUNTER — Encounter (HOSPITAL_COMMUNITY): Payer: Self-pay

## 2023-04-21 ENCOUNTER — Ambulatory Visit (INDEPENDENT_AMBULATORY_CARE_PROVIDER_SITE_OTHER): Payer: Medicare Other

## 2023-04-21 DIAGNOSIS — R030 Elevated blood-pressure reading, without diagnosis of hypertension: Secondary | ICD-10-CM | POA: Diagnosis not present

## 2023-04-21 DIAGNOSIS — M1990 Unspecified osteoarthritis, unspecified site: Secondary | ICD-10-CM

## 2023-04-21 MED ORDER — CELECOXIB 100 MG PO CAPS: 100.0000 mg | ORAL_CAPSULE | Freq: Two times a day (BID) | ORAL | 0 refills | Status: AC

## 2023-04-21 NOTE — ED Triage Notes (Signed)
Patient here today with c/o right hand pain and swelling since yesterday. No known injury but she does put up stock at work and noticed that at times, she will have pain in her right hand.

## 2023-04-21 NOTE — Discharge Instructions (Addendum)
The x-ray reading we discussed is preliminary. Your x-ray will be read by a radiologist in next few hours. If there is a discrepancy, you will be contacted, and instructed on a new plan for you care.   It is recommended you establish with a primary care provider for further evaluation of your blood pressure

## 2023-04-21 NOTE — ED Provider Notes (Signed)
[facility]    CSN: [id number] Arrival date & time: 04/21/23  1253      History   Chief Complaint Chief Complaint  Patient presents with   Hand Pain    HPI Larkin Alfred is a 67 y.o. female.   The history is provided by the patient.  Hand Pain  Pain and swelling right hand no injury.  Symptoms worsened yesterday.  States her work made her come and get checked.  She is left-handed.  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 Medical history includes arthritis, hypertension she does not take any daily medications.  She took ibuprofen for her hand pain.  Past Medical History:  Diagnosis Date   Arthritis    Hypertension     There are no problems to display for this patient.   Past Surgical History:  Procedure Laterality Date   ABDOMINAL HYSTERECTOMY      OB History   No obstetric history on file.      Home Medications    Prior to Admission medications   Medication Sig Start Date End Date Taking? Authorizing Provider  hydrocortisone (ANUSOL-HC) 25 MG suppository Place 1 suppository (25 mg total) rectally 2 (two) times daily. 04/17/22   Raspet, Noberto Retort, PA-C  ibuprofen (ADVIL) 600 MG tablet Take 1 tablet (600 mg total) by mouth every 6 (six) hours as needed. 05/13/21   Redwine, Madison A, PA-C  ondansetron (ZOFRAN-ODT) 4 MG disintegrating tablet Take 1 tablet (4 mg total) by mouth every 8 (eight) hours as needed for nausea or vomiting. 04/17/22   Raspet, Noberto Retort, PA-C  Skin Protectants, Misc. (EUCERIN) cream Apply topically as needed for dry skin. 03/08/20   Darr, Gerilyn Pilgrim, PA-C    Family History Family History  Problem Relation Age of Onset   Healthy Mother    Healthy Father     Social History Social History   Tobacco Use   Smoking status: Former    Current packs/day: 1.00    Types: Cigarettes   Smokeless tobacco: Never  Vaping Use   Vaping status: Never Used  Substance Use Topics    Alcohol use: Yes    Comment: occ   Drug use: No     Allergies   Patient has no known allergies.   Review of Systems Review of Systems  Constitutional:  Negative for chills and fever.  Musculoskeletal:  Positive for arthralgias and joint swelling.  Skin:  Negative for color change, rash and wound.  Neurological:  Negative for numbness.     Physical Exam Triage Vital Signs ED Triage Vitals  Encounter Vitals Group     BP 04/21/23 1321 (!) 156/91     Systolic BP Percentile --      Diastolic BP Percentile --      Pulse Rate 04/21/23 1321 64     Resp 04/21/23 1321 16     Temp 04/21/23 1321 98 F (36.7 C)     Temp Source 04/21/23 1321 Oral     SpO2 04/21/23 1321 93 %     Weight --      Height --      Head Circumference --      Peak Flow --      Pain Score 04/21/23 1320 9     Pain Loc --      Pain Education --      Exclude from Growth Chart --    No data found.  Updated Vital Signs BP (!) 156/91 (BP Location: Left Arm)   Pulse 64   Temp 98 F (36.7 C) (Oral)   Resp 16   SpO2 93%   Visual Acuity Right Eye Distance:   Left Eye Distance:   Bilateral Distance:    Right Eye Near:   Left Eye Near:    Bilateral Near:     Physical Exam Vitals and nursing note reviewed.  Constitutional:      Appearance: Normal appearance.  Musculoskeletal:     Comments: Right hand chronic appearing bony changes consistent with arthritis mild swelling dorsal hand near second and third MCPJ's, no erythema, no increased heat, no breaks in the skin.  Moves all digits, capillary refill brisk  Skin:    General: Skin is warm and dry.     Findings: No bruising, erythema or rash.  Neurological:     Mental Status: She is alert and oriented to person, place, and time.  Psychiatric:        Mood and Affect: Mood normal.      UC Treatments / Results  Labs (all labs ordered are listed, but only abnormal results are displayed) Labs Reviewed - No data to  display  EKG   Radiology No results found.  Procedures Procedures (including critical care time)  Medications Ordered in UC Medications - No data to display  Initial Impression / Assessment and Plan / UC Course  I have reviewed the triage vital signs and the nursing notes.  Pertinent labs & imaging results that were available during my care of the patient were reviewed by me and considered in my medical decision making (see chart for details).     67 year old female with right hand pain and swelling without known injury, exam consistent with arthritis, will check x-ray. Right hand x-ray degenerative changes no fracture. Patient with elevated blood pressure has had elevated blood pressure on multiple visits, states not take education for blood pressure, does not have a PCP  Final Clinical Impressions(s) / UC Diagnoses   Final diagnoses:  None   Discharge Instructions   None    ED Prescriptions   None    PDMP not reviewed this encounter.   Meliton Rattan, [place] 04/21/23 1420

## 2024-05-05 ENCOUNTER — Encounter (HOSPITAL_COMMUNITY): Payer: Self-pay

## 2024-05-05 ENCOUNTER — Ambulatory Visit (HOSPITAL_COMMUNITY)
Admission: EM | Admit: 2024-05-05 | Discharge: 2024-05-05 | Disposition: A | Discharge status: Home / Self Care | Attending: Family Medicine | Admitting: Family Medicine

## 2024-05-05 DIAGNOSIS — R42 Dizziness and giddiness: Secondary | ICD-10-CM | POA: Insufficient documentation

## 2024-05-05 DIAGNOSIS — J4521 Mild intermittent asthma with (acute) exacerbation: Secondary | ICD-10-CM | POA: Diagnosis not present

## 2024-05-05 DIAGNOSIS — J069 Acute upper respiratory infection, unspecified: Secondary | ICD-10-CM | POA: Insufficient documentation

## 2024-05-05 DIAGNOSIS — I10 Essential (primary) hypertension: Secondary | ICD-10-CM | POA: Diagnosis not present

## 2024-05-05 LAB — POC COVID19/FLU A&B COMBO
Covid Antigen, POC: NEGATIVE
Influenza A Antigen, POC: NEGATIVE
Influenza B Antigen, POC: NEGATIVE

## 2024-05-05 LAB — CBC
HCT: 39.9 % (ref 36.0–46.0)
Hemoglobin: 13.3 g/dL (ref 12.0–15.0)
MCH: 31.1 pg (ref 26.0–34.0)
MCHC: 33.3 g/dL (ref 30.0–36.0)
MCV: 93.4 fL (ref 80.0–100.0)
Platelets: 252 K/uL (ref 150–400)
RBC: 4.27 MIL/uL (ref 3.87–5.11)
RDW: 13.9 % (ref 11.5–15.5)
WBC: 5.4 K/uL (ref 4.0–10.5)
nRBC: 0 % (ref 0.0–0.2)

## 2024-05-05 LAB — BASIC METABOLIC PANEL WITH GFR
Anion gap: 12 (ref 5–15)
BUN: 11 mg/dL (ref 8–23)
CO2: 27 mmol/L (ref 22–32)
Calcium: 9.5 mg/dL (ref 8.9–10.3)
Chloride: 102 mmol/L (ref 98–111)
Creatinine, Ser: 0.66 mg/dL (ref 0.44–1.00)
GFR, Estimated: >60 mL/min (ref >60)
Glucose, Bld: 92 mg/dL (ref 70–99)
Potassium: 4 mmol/L (ref 3.5–5.1)
Sodium: 141 mmol/L (ref 135–145)

## 2024-05-05 MED ORDER — ALBUTEROL SULFATE HFA 108 (90 BASE) MCG/ACT IN AERS: 2.0000 | INHALATION_SPRAY | RESPIRATORY_TRACT | 0 refills | Status: AC | PRN

## 2024-05-05 MED ORDER — BENZONATATE 100 MG PO CAPS: 100.0000 mg | ORAL_CAPSULE | Freq: Three times a day (TID) | ORAL | 0 refills | Status: DC | PRN

## 2024-05-05 MED ORDER — AMLODIPINE BESYLATE 5 MG PO TABS: 5.0000 mg | ORAL_TABLET | Freq: Every day | ORAL | 2 refills | Status: AC

## 2024-05-05 MED ORDER — PREDNISONE 20 MG PO TABS: 40.0000 mg | ORAL_TABLET | Freq: Every day | ORAL | 0 refills | Status: AC

## 2024-05-05 NOTE — Discharge Instructions (Addendum)
 The COVID and flu tests are negative  Albuterol  inhaler--do 2 puffs every 4 hours as needed for shortness of breath or wheezing  Take prednisone  20 mg--2 daily for 5 days  Take benzonatate  100 mg, 1 tab every 8 hours as needed for cough.  Amlodipine 5 mg--take 1 daily for blood pressure.  We have drawn blood to check your blood counts and sodium and potassium and kidney function and sugar.  Staff will notify you if there is anything significantly abnormal   Follow-up with primary care.  They may need to adjust doses of the blood pressure medication or add medications.  If you worsen in any way, please go to the emergency room for further evaluation

## 2024-05-05 NOTE — ED Triage Notes (Signed)
 Pt c/o cough, chest congestion, and dizziness while at work yesterday. States needs a work note for today. Denies taken any meds. States yesterday had to stop d/t SOB. NAD

## 2024-05-05 NOTE — ED Provider Notes (Signed)
 [facility]    CSN: [id number] Arrival date & time: 05/05/24  1248      History   Chief Complaint Chief Complaint  Patient presents with   Cough   Dizziness    HPI [name] is a 68 y.o. female.    Cough Dizziness  Here for cough and nasal congestion and chest congestion.  The symptoms began yesterday.  She has felt hot and cold.  She also has had dizziness off and on for a while and had it yesterday at work and she had it some this morning when she got in the shower.  No vomiting or diarrhea or nausea  She does note having had some sharp chest pains overnight when she was lying down but that improved with sitting up.  She is not having chest pain here in the clinic.  She has had some asthma trouble in the past  She also has been aware that she has high blood pressure, but does not take any medications for it and does not have a PCP.    Past Medical History:  Diagnosis Date   Arthritis    Hypertension     There are no active problems to display for this patient.   Past Surgical History:  Procedure Laterality Date   ABDOMINAL HYSTERECTOMY      OB History   No obstetric history on file.      Home Medications    Prior to Admission medications   Medication Sig Start Date End Date Taking? Authorizing Provider  albuterol  (VENTOLIN  HFA) 108 (90 Base) MCG/ACT inhaler Inhale 2 puffs into the lungs every 4 (four) hours as needed for wheezing or shortness of breath. 05/05/24  Yes Jomes Giraldo K, MD  amLODipine (NORVASC) 5 MG tablet Take 1 tablet (5 mg total) by mouth daily. For blood pressure 05/05/24  Yes Yossef Gilkison, Sharlet POUR, MD  benzonatate  (TESSALON ) 100 MG capsule Take 1 capsule (100 mg total) by mouth 3 (three) times daily as needed for cough. 05/05/24  Yes Vonna Sharlet POUR, MD  predniSONE  (DELTASONE ) 20 MG tablet Take 2 tablets (40 mg total) by mouth daily with breakfast for 5 days. 05/05/24 05/10/24 Yes Brynlei Klausner, Sharlet POUR, MD     Family History Family History  Problem Relation Age of Onset   Healthy Mother    Healthy Father     Social History Social History   Tobacco Use   Smoking status: Former    Current packs/day: 1.00    Types: Cigarettes   Smokeless tobacco: Never  Vaping Use   Vaping status: Never Used  Substance Use Topics   Alcohol use: Yes    Comment: occ   Drug use: No     Allergies   Patient has no known allergies.   Review of Systems Review of Systems  Respiratory:  Positive for cough.   Neurological:  Positive for dizziness.     Physical Exam Triage Vital Signs ED Triage Vitals  Encounter Vitals Group     BP 05/05/24 1306 (!) 153/103     Girls Systolic BP Percentile --      Girls Diastolic BP Percentile --      Boys Systolic BP Percentile --      Boys Diastolic BP Percentile --      Pulse Rate 05/05/24 1306 78     Resp 05/05/24 1306 18     Temp 05/05/24 1306 98.9 F (37.2 C)     Temp Source 05/05/24 1306 Oral  SpO2 05/05/24 1306 96 %     Weight --      Height --      Head Circumference --      Peak Flow --      Pain Score 05/05/24 1303 0     Pain Loc --      Pain Education --      Exclude from Growth Chart --    No data found.  Updated Vital Signs BP (!) 153/103 (BP Location: Right Arm)   Pulse 78   Temp 98.9 F (37.2 C) (Oral)   Resp 18   SpO2 96%   Visual Acuity Right Eye Distance:   Left Eye Distance:   Bilateral Distance:    Right Eye Near:   Left Eye Near:    Bilateral Near:     Physical Exam Vitals reviewed.  Constitutional:      General: She is not in acute distress.    Appearance: She is not toxic-appearing.  HENT:     Right Ear: Tympanic membrane and ear canal normal.     Left Ear: Tympanic membrane and ear canal normal.     Nose: Congestion present.     Mouth/Throat:     Mouth: Mucous membranes are moist.     Comments: There is clear mucus draining Eyes:     Extraocular Movements: Extraocular movements intact.      Conjunctiva/sclera: Conjunctivae normal.     Pupils: Pupils are equal, round, and reactive to light.  Cardiovascular:     Rate and Rhythm: Normal rate and regular rhythm.     Heart sounds: No murmur heard. Pulmonary:     Effort: No respiratory distress.     Breath sounds: No stridor. No rhonchi or rales.     Comments: There are a few end expiratory wheezes heard, right more than left Chest:     Chest wall: No tenderness.  Musculoskeletal:     Cervical back: Neck supple.  Lymphadenopathy:     Cervical: No cervical adenopathy.  Skin:    Capillary Refill: Capillary refill takes less than 2 seconds.     Coloration: Skin is not jaundiced or pale.  Neurological:     General: No focal deficit present.     Mental Status: She is alert and oriented to person, place, and time.  Psychiatric:        Behavior: Behavior normal.      UC Treatments / Results  Labs (all labs ordered are listed, but only abnormal results are displayed) Labs Reviewed  CBC  BASIC METABOLIC PANEL WITH GFR  POC COVID19/FLU A&B COMBO    EKG   Radiology No results found.  Procedures Procedures (including critical care time)  Medications Ordered in UC Medications - No data to display  Initial Impression / Assessment and Plan / UC Course  I have reviewed the triage vital signs and the nursing notes.  Pertinent labs & imaging results that were available during my care of the patient were reviewed by me and considered in my medical decision making (see chart for details).     COVID and flu swabs are negative.  Prednisone  and albuterol  are sent in to treat the asthma exacerbation.  Tessalon  Perles are also sent in to treat her cough.  Amlodipine 5 mg is sent in to begin treating her hypertension.  Staff will help her make a primary care appointment.  CBC and BMP are drawn today and staff will notify her if anything is significantly abnormal. Final  Clinical Impressions(s) / UC Diagnoses   Final  diagnoses:  Viral URI  Dizziness  Essential hypertension, benign  Mild intermittent asthma with (acute) exacerbation     Discharge Instructions      The COVID and flu tests are negative  Albuterol  inhaler--do 2 puffs every 4 hours as needed for shortness of breath or wheezing  Take prednisone  20 mg--2 daily for 5 days  Take benzonatate  100 mg, 1 tab every 8 hours as needed for cough.  Amlodipine 5 mg--take 1 daily for blood pressure.  We have drawn blood to check your blood counts and sodium and potassium and kidney function and sugar.  Staff will notify you if there is anything significantly abnormal   Follow-up with primary care.  They may need to adjust doses of the blood pressure medication or add medications.  If you worsen in any way, please go to the emergency room for further evaluation     ED Prescriptions     Medication Sig Dispense Auth. Provider   albuterol  (VENTOLIN  HFA) 108 (90 Base) MCG/ACT inhaler Inhale 2 puffs into the lungs every 4 (four) hours as needed for wheezing or shortness of breath. 1 each Vonna Sharlet POUR, MD   predniSONE  (DELTASONE ) 20 MG tablet Take 2 tablets (40 mg total) by mouth daily with breakfast for 5 days. 10 tablet Vonna Sharlet POUR, MD   benzonatate  (TESSALON ) 100 MG capsule Take 1 capsule (100 mg total) by mouth 3 (three) times daily as needed for cough. 21 capsule Fleetwood Pierron K, MD   amLODipine (NORVASC) 5 MG tablet Take 1 tablet (5 mg total) by mouth daily. For blood pressure 30 tablet Chauncey Sciulli K, MD      PDMP not reviewed this encounter.   Vonna Sharlet POUR, MD 05/05/24 1400

## 2024-05-06 ENCOUNTER — Ambulatory Visit (HOSPITAL_COMMUNITY): Payer: Self-pay

## 2024-06-07 ENCOUNTER — Ambulatory Visit (HOSPITAL_COMMUNITY)
Admission: EM | Admit: 2024-06-07 | Discharge: 2024-06-07 | Disposition: A | Discharge status: Home / Self Care | Attending: Family Medicine | Admitting: Family Medicine

## 2024-06-07 ENCOUNTER — Encounter (HOSPITAL_COMMUNITY): Payer: Self-pay | Admitting: Emergency Medicine

## 2024-06-07 DIAGNOSIS — S0501XA Injury of conjunctiva and corneal abrasion without foreign body, right eye, initial encounter: Secondary | ICD-10-CM | POA: Diagnosis not present

## 2024-06-07 DIAGNOSIS — Z23 Encounter for immunization: Secondary | ICD-10-CM | POA: Diagnosis not present

## 2024-06-07 MED ORDER — TETRACAINE HCL 0.5 % OP SOLN
OPHTHALMIC | Status: AC
  Filled 2024-06-07: qty 4

## 2024-06-07 MED ORDER — FLUORESCEIN SODIUM 1 MG OP STRP
ORAL_STRIP | OPHTHALMIC | Status: AC
  Filled 2024-06-07: qty 1

## 2024-06-07 MED ORDER — ERYTHROMYCIN 5 MG/GM OP OINT: TOPICAL_OINTMENT | OPHTHALMIC | 0 refills | Status: AC

## 2024-06-07 MED ORDER — TETANUS-DIPHTH-ACELL PERTUSSIS 5-2-15.5 LF-MCG/0.5 IM SUSP
INTRAMUSCULAR | Status: AC
  Filled 2024-06-07: qty 0.5

## 2024-06-07 MED ORDER — TETANUS-DIPHTH-ACELL PERTUSSIS 5-2-15.5 LF-MCG/0.5 IM SUSP
0.5000 mL | Freq: Once | INTRAMUSCULAR | Status: AC
  Administered 2024-06-07: 0.5 mL via INTRAMUSCULAR

## 2024-06-07 NOTE — Discharge Instructions (Addendum)
 You have a corneal abrasion. Your tetanus booster was updated today. You have been prescribed erythromycin ointment.  Apply a small ribbon inside your bottom eyelid twice daily for 7 days.  You will have blurry vision for approximately 30 minutes after you apply the ointment. Apply warm compresses to your right eye several times a day. Follow-up with ophthalmology, Dr. McCuen is on call for us  today.  Contact information: [phone] [address] [place], [place]  [address] If you develop any new or worsening symptoms or if your symptoms do not start to improve, please return here or follow-up with your primary care provider.  If your symptoms are severe, please go to the emergency room.

## 2024-06-07 NOTE — ED Provider Notes (Signed)
 [facility]    CSN: [id number] Arrival date & time: 06/07/24  1444      History   Chief Complaint Chief Complaint  Patient presents with   Eye Problem    HPI [name] is a 68 y.o. female.   This 68 year old female is being seen for concerns of right eye pain.  She reports last night she was washing dishes when she feels she may have gotten soap in her eye.  She says she used a Q-tip to try to get soap out of her eye.  She went to bed last night and when she woke this morning her eye was burning.  She reports it feels like there is something in her eye.  She went to work where they assisted her with flushing her eyes with water.  She continues to scratch at her eyes with long artificial fingernails.  She reports continued sensation that something is in her eye.  She reports eye has burning and some blurry vision.  She denies other symptoms.   Eye Problem Associated symptoms: discharge and itching     Past Medical History:  Diagnosis Date   Arthritis    Hypertension     There are no active problems to display for this patient.   Past Surgical History:  Procedure Laterality Date   ABDOMINAL HYSTERECTOMY      OB History   No obstetric history on file.      Home Medications    Prior to Admission medications   Medication Sig Start Date End Date Taking? Authorizing Provider  erythromycin ophthalmic ointment Place a 1/2 inch ribbon of ointment into the lower eyelid two times daily for 7 days. 06/07/24  Yes Laronda Lisby C, FNP  albuterol  (VENTOLIN  HFA) 108 (90 Base) MCG/ACT inhaler Inhale 2 puffs into the lungs every 4 (four) hours as needed for wheezing or shortness of breath. 05/05/24   Vonna Sharlet POUR, MD  amLODipine  (NORVASC ) 5 MG tablet Take 1 tablet (5 mg total) by mouth daily. For blood pressure 05/05/24   Banister, Sharlet POUR, MD    Family History Family History  Problem Relation Age of Onset   Healthy Mother    Healthy Father      Social History Social History   Tobacco Use   Smoking status: Former    Current packs/day: 1.00    Types: Cigarettes   Smokeless tobacco: Never  Vaping Use   Vaping status: Never Used  Substance Use Topics   Alcohol use: Yes    Comment: occ   Drug use: No     Allergies   Patient has no known allergies.   Review of Systems Review of Systems  Constitutional:  Negative for chills and fever.  Eyes:  Positive for pain, discharge, itching and visual disturbance.  Respiratory:  Negative for shortness of breath.   Cardiovascular:  Negative for chest pain.     Physical Exam Triage Vital Signs ED Triage Vitals  Encounter Vitals Group     BP 06/07/24 1555 (!) 141/80     Girls Systolic BP Percentile --      Girls Diastolic BP Percentile --      Boys Systolic BP Percentile --      Boys Diastolic BP Percentile --      Pulse Rate 06/07/24 1555 76     Resp 06/07/24 1555 17     Temp 06/07/24 1555 98.1 F (36.7 C)     Temp Source 06/07/24 1555 Oral  SpO2 06/07/24 1555 95 %     Weight --      Height --      Head Circumference --      Peak Flow --      Pain Score 06/07/24 1554 10     Pain Loc --      Pain Education --      Exclude from Growth Chart --    No data found.  Updated Vital Signs BP (!) 141/80 (BP Location: Right Arm)   Pulse 76   Temp 98.1 F (36.7 C) (Oral)   Resp 17   SpO2 95%   Visual Acuity Right Eye Distance:   Left Eye Distance:   Bilateral Distance:    Right Eye Near:   Left Eye Near:    Bilateral Near:     Physical Exam Vitals and nursing note reviewed.  Constitutional:      General: She is not in acute distress.    Appearance: She is well-developed.     Comments: Pleasant female appearing stated age found sitting in chair in no acute distress.  HENT:     Head: Normocephalic and atraumatic.     Right Ear: External ear normal.     Left Ear: External ear normal.  Eyes:     General: Lids are normal.        Right eye: Discharge  present.     Comments: Watery discharge.  Right corneal abrasion at 3:00.  Cardiovascular:     Rate and Rhythm: Normal rate and regular rhythm.     Heart sounds: Normal heart sounds.  Pulmonary:     Effort: Pulmonary effort is normal. No respiratory distress.     Breath sounds: Normal breath sounds.  Abdominal:     Palpations: Abdomen is soft.     Tenderness: There is no abdominal tenderness.  Musculoskeletal:        General: No swelling.     Cervical back: Neck supple.  Skin:    General: Skin is warm and dry.     Capillary Refill: Capillary refill takes less than 2 seconds.  Neurological:     Mental Status: She is alert.  Psychiatric:        Mood and Affect: Mood normal.      UC Treatments / Results  Labs (all labs ordered are listed, but only abnormal results are displayed) Labs Reviewed - No data to display  EKG   Radiology No results found.  Procedures Procedures (including critical care time)  Medications Ordered in UC Medications  Tdap (ADACEL) injection 0.5 mL (0.5 mLs Intramuscular Given 06/07/24 1746)    Initial Impression / Assessment and Plan / UC Course  I have reviewed the triage vital signs and the nursing notes.  Pertinent labs & imaging results that were available during my care of the patient were reviewed by me and considered in my medical decision making (see chart for details).     Triage and vitals reviewed, patient is hemodynamically stable.  Right corneal abrasion.  She is given prescription for erythromycin ointment.  She was given follow-up information for ophthalmology.  Plan of care, follow-up care, return precautions reviewed.  Patient verbalizes understanding and has no questions. Final Clinical Impressions(s) / UC Diagnoses   Final diagnoses:  Abrasion of right cornea, initial encounter     Discharge Instructions      You have a corneal abrasion. Your tetanus booster was updated today. You have been prescribed erythromycin  ointment.  Apply a  small ribbon inside your bottom eyelid twice daily for 7 days.  You will have blurry vision for approximately 30 minutes after you apply the ointment. Apply warm compresses to your right eye several times a day. Follow-up with ophthalmology, Dr. McCuen is on call for us  today.  Contact information: [phone] [address] [place], [place]  [address] If you develop any new or worsening symptoms or if your symptoms do not start to improve, please return here or follow-up with your primary care provider.  If your symptoms are severe, please go to the emergency room.      ED Prescriptions     Medication Sig Dispense Auth. Provider   erythromycin ophthalmic ointment Place a 1/2 inch ribbon of ointment into the lower eyelid two times daily for 7 days. 3.5 g Brecken Dewoody C, FNP      PDMP not reviewed this encounter.   Lennice Jon BROCKS, FNP 06/07/24 1750

## 2024-06-07 NOTE — ED Triage Notes (Signed)
 Pt reports got some dish soap in right eye yesterday. Tried to clean eye out with Q-tip last night. Reports today eye burning sensation and at work tried to flush eye out but still feels like something in it and burning.

## 2024-07-21 ENCOUNTER — Ambulatory Visit: Admitting: Family Medicine
